# Patient Record
Sex: Male | Born: 1975 | Race: White | Hispanic: No | Marital: Married | Smoking: Current every day smoker
Health system: Southern US, Community
[De-identification: ages and names within clinical notes are randomized; demographics above are authoritative.]

## PROBLEM LIST (undated history)

## (undated) DIAGNOSIS — R55 Syncope and collapse: Secondary | ICD-10-CM

## (undated) HISTORY — PX: APPENDECTOMY: SHX54

---

## 2010-08-13 ENCOUNTER — Emergency Department: Payer: Self-pay | Admitting: Unknown Physician Specialty

## 2011-01-14 ENCOUNTER — Emergency Department: Payer: Self-pay | Admitting: Emergency Medicine

## 2011-12-02 ENCOUNTER — Emergency Department: Payer: Self-pay | Admitting: Unknown Physician Specialty

## 2011-12-02 LAB — COMPREHENSIVE METABOLIC PANEL
Alkaline Phosphatase: 52 U/L (ref 50–136)
Anion Gap: 9 (ref 7–16)
BUN: 9 mg/dL (ref 7–18)
Bilirubin,Total: 0.3 mg/dL (ref 0.2–1.0)
Creatinine: 0.85 mg/dL (ref 0.60–1.30)
EGFR (Non-African Amer.): >60
SGOT(AST): 10 U/L — ABNORMAL LOW (ref 15–37)
SGPT (ALT): 22 U/L (ref 12–78)
Total Protein: 7.5 g/dL (ref 6.4–8.2)

## 2011-12-02 LAB — URINALYSIS, COMPLETE
Bilirubin,UR: NEGATIVE
Ketone: NEGATIVE
Nitrite: NEGATIVE
Protein: NEGATIVE
RBC,UR: NONE SEEN /HPF (ref 0–5)
WBC UR: NONE SEEN /HPF (ref 0–5)

## 2011-12-02 LAB — CBC WITH DIFFERENTIAL/PLATELET
Basophil %: 0.6 %
Eosinophil %: 0.9 %
HCT: 40.1 % (ref 40.0–52.0)
HGB: 13.9 g/dL (ref 13.0–18.0)
Lymphocyte #: 2.8 10*3/uL (ref 1.0–3.6)
MCH: 31.8 pg (ref 26.0–34.0)
Monocyte #: 0.4 x10 3/mm (ref 0.2–1.0)
Monocyte %: 4.2 %
Neutrophil #: 6.3 10*3/uL (ref 1.4–6.5)
Neutrophil %: 65.6 %
Platelet: 345 10*3/uL (ref 150–440)
RBC: 4.37 10*6/uL — ABNORMAL LOW (ref 4.40–5.90)
RDW: 13.7 % (ref 11.5–14.5)
WBC: 9.6 10*3/uL (ref 3.8–10.6)

## 2011-12-02 LAB — AMYLASE: Amylase: 73 U/L (ref 25–115)

## 2012-10-12 ENCOUNTER — Emergency Department: Payer: Self-pay | Admitting: Emergency Medicine

## 2012-10-12 LAB — CBC
HCT: 42.8 % (ref 40.0–52.0)
HGB: 14.6 g/dL (ref 13.0–18.0)
MCHC: 34.2 g/dL (ref 32.0–36.0)
MCV: 89 fL (ref 80–100)
RBC: 4.83 10*6/uL (ref 4.40–5.90)
RDW: 13.1 % (ref 11.5–14.5)
WBC: 10.6 10*3/uL (ref 3.8–10.6)

## 2012-10-12 LAB — COMPREHENSIVE METABOLIC PANEL
Albumin: 3.9 g/dL (ref 3.4–5.0)
Alkaline Phosphatase: 53 U/L (ref 50–136)
Anion Gap: 4 — ABNORMAL LOW (ref 7–16)
BUN: 8 mg/dL (ref 7–18)
Calcium, Total: 8.9 mg/dL (ref 8.5–10.1)
Co2: 26 mmol/L (ref 21–32)
Creatinine: 0.85 mg/dL (ref 0.60–1.30)
EGFR (Non-African Amer.): >60
Osmolality: 279 (ref 275–301)
Potassium: 3.6 mmol/L (ref 3.5–5.1)
SGOT(AST): 20 U/L (ref 15–37)
Sodium: 140 mmol/L (ref 136–145)
Total Protein: 7.4 g/dL (ref 6.4–8.2)

## 2012-10-12 LAB — URINALYSIS, COMPLETE
Ketone: NEGATIVE
Ph: 6 (ref 4.5–8.0)
RBC,UR: 1 /HPF (ref 0–5)
Squamous Epithelial: NONE SEEN

## 2012-10-31 ENCOUNTER — Emergency Department: Payer: Self-pay | Admitting: Internal Medicine

## 2012-10-31 LAB — BASIC METABOLIC PANEL
Anion Gap: 6 — ABNORMAL LOW (ref 7–16)
Calcium, Total: 9.1 mg/dL (ref 8.5–10.1)
Chloride: 108 mmol/L — ABNORMAL HIGH (ref 98–107)
Co2: 23 mmol/L (ref 21–32)
Creatinine: 0.98 mg/dL (ref 0.60–1.30)
EGFR (African American): >60
EGFR (Non-African Amer.): >60
Glucose: 124 mg/dL — ABNORMAL HIGH (ref 65–99)
Osmolality: 275 (ref 275–301)
Sodium: 137 mmol/L (ref 136–145)

## 2012-10-31 LAB — URINALYSIS, COMPLETE
Bacteria: NONE SEEN
Bilirubin,UR: NEGATIVE
Blood: NEGATIVE
Glucose,UR: NEGATIVE mg/dL (ref 0–75)
Ketone: NEGATIVE
Ph: 7 (ref 4.5–8.0)
Protein: NEGATIVE
RBC,UR: <1 /HPF (ref 0–5)
Specific Gravity: 1.005 (ref 1.003–1.030)
Squamous Epithelial: NONE SEEN
WBC UR: NONE SEEN /HPF (ref 0–5)

## 2012-10-31 LAB — CBC
MCHC: 34.4 g/dL (ref 32.0–36.0)
Platelet: 362 10*3/uL (ref 150–440)
RDW: 13.5 % (ref 11.5–14.5)
WBC: 10.8 10*3/uL — ABNORMAL HIGH (ref 3.8–10.6)

## 2013-04-18 ENCOUNTER — Emergency Department (HOSPITAL_COMMUNITY)
Admission: EM | Admit: 2013-04-18 | Discharge: 2013-04-18 | Disposition: A | Discharge status: Home / Self Care | Payer: Self-pay | Attending: Emergency Medicine | Admitting: Emergency Medicine

## 2013-04-18 ENCOUNTER — Encounter (HOSPITAL_COMMUNITY): Payer: Self-pay | Admitting: Emergency Medicine

## 2013-04-18 ENCOUNTER — Emergency Department (HOSPITAL_COMMUNITY): Payer: Self-pay

## 2013-04-18 DIAGNOSIS — R197 Diarrhea, unspecified: Secondary | ICD-10-CM | POA: Insufficient documentation

## 2013-04-18 DIAGNOSIS — F17200 Nicotine dependence, unspecified, uncomplicated: Secondary | ICD-10-CM | POA: Insufficient documentation

## 2013-04-18 DIAGNOSIS — R109 Unspecified abdominal pain: Secondary | ICD-10-CM

## 2013-04-18 DIAGNOSIS — K439 Ventral hernia without obstruction or gangrene: Secondary | ICD-10-CM | POA: Insufficient documentation

## 2013-04-18 DIAGNOSIS — K429 Umbilical hernia without obstruction or gangrene: Secondary | ICD-10-CM | POA: Insufficient documentation

## 2013-04-18 DIAGNOSIS — IMO0001 Reserved for inherently not codable concepts without codable children: Secondary | ICD-10-CM | POA: Insufficient documentation

## 2013-04-18 DIAGNOSIS — R110 Nausea: Secondary | ICD-10-CM | POA: Insufficient documentation

## 2013-04-18 DIAGNOSIS — K458 Other specified abdominal hernia without obstruction or gangrene: Secondary | ICD-10-CM

## 2013-04-18 LAB — CBC WITH DIFFERENTIAL/PLATELET
Basophils Absolute: 0 10*3/uL (ref 0.0–0.1)
Basophils Relative: 0 % (ref 0–1)
EOS PCT: 0 % (ref 0–5)
Eosinophils Absolute: 0 10*3/uL (ref 0.0–0.7)
HEMATOCRIT: 45.1 % (ref 39.0–52.0)
HEMOGLOBIN: 15.5 g/dL (ref 13.0–17.0)
LYMPHS ABS: 2.1 10*3/uL (ref 0.7–4.0)
LYMPHS PCT: 16 % (ref 12–46)
MCH: 30 pg (ref 26.0–34.0)
MCHC: 34.4 g/dL (ref 30.0–36.0)
MCV: 87.2 fL (ref 78.0–100.0)
MONO ABS: 0.4 10*3/uL (ref 0.1–1.0)
MONOS PCT: 3 % (ref 3–12)
Neutro Abs: 10.2 10*3/uL — ABNORMAL HIGH (ref 1.7–7.7)
Neutrophils Relative %: 80 % — ABNORMAL HIGH (ref 43–77)
Platelets: 381 10*3/uL (ref 150–400)
RBC: 5.17 MIL/uL (ref 4.22–5.81)
RDW: 12.9 % (ref 11.5–15.5)
WBC: 12.8 10*3/uL — AB (ref 4.0–10.5)

## 2013-04-18 LAB — URINALYSIS, ROUTINE W REFLEX MICROSCOPIC
Bilirubin Urine: NEGATIVE
GLUCOSE, UA: NEGATIVE mg/dL
Hgb urine dipstick: NEGATIVE
Ketones, ur: NEGATIVE mg/dL
LEUKOCYTES UA: NEGATIVE
Nitrite: NEGATIVE
PH: 8 (ref 5.0–8.0)
Protein, ur: NEGATIVE mg/dL
Specific Gravity, Urine: 1.02 (ref 1.005–1.030)
Urobilinogen, UA: 0.2 mg/dL (ref 0.0–1.0)

## 2013-04-18 LAB — BASIC METABOLIC PANEL
BUN: 10 mg/dL (ref 6–23)
CHLORIDE: 105 meq/L (ref 96–112)
CO2: 28 meq/L (ref 19–32)
CREATININE: 0.8 mg/dL (ref 0.50–1.35)
Calcium: 9.6 mg/dL (ref 8.4–10.5)
GFR calc Af Amer: >90 mL/min (ref >90)
GFR calc non Af Amer: >90 mL/min (ref >90)
Glucose, Bld: 96 mg/dL (ref 70–99)
POTASSIUM: 4.4 meq/L (ref 3.7–5.3)
Sodium: 143 mEq/L (ref 137–147)

## 2013-04-18 MED ORDER — LOPERAMIDE HCL 2 MG PO TABS: 2.0000 mg | ORAL_TABLET | Freq: Four times a day (QID) | ORAL | Status: DC | PRN

## 2013-04-18 MED ORDER — PROMETHAZINE HCL 25 MG PO TABS: 25.0000 mg | ORAL_TABLET | Freq: Four times a day (QID) | ORAL | Status: DC | PRN

## 2013-04-18 MED ORDER — HYDROMORPHONE HCL PF 1 MG/ML IJ SOLN
1.0000 mg | Freq: Once | INTRAMUSCULAR | Status: AC
  Administered 2013-04-18: 1 mg via INTRAVENOUS
  Filled 2013-04-18: qty 1

## 2013-04-18 MED ORDER — HYDROCODONE-ACETAMINOPHEN 5-325 MG PO TABS: 1.0000 | ORAL_TABLET | Freq: Four times a day (QID) | ORAL | Status: DC | PRN

## 2013-04-18 MED ORDER — IOHEXOL 300 MG/ML  SOLN
100.0000 mL | Freq: Once | INTRAMUSCULAR | Status: AC | PRN
  Administered 2013-04-18: 100 mL via INTRAVENOUS

## 2013-04-18 MED ORDER — SODIUM CHLORIDE 0.9 % IV SOLN: INTRAVENOUS | Status: DC

## 2013-04-18 MED ORDER — SODIUM CHLORIDE 0.9 % IV BOLUS (SEPSIS)
1000.0000 mL | Freq: Once | INTRAVENOUS | Status: AC
  Administered 2013-04-18: 1000 mL via INTRAVENOUS

## 2013-04-18 MED ORDER — ONDANSETRON HCL 4 MG/2ML IJ SOLN
4.0000 mg | Freq: Once | INTRAMUSCULAR | Status: AC
  Administered 2013-04-18: 4 mg via INTRAVENOUS
  Filled 2013-04-18: qty 2

## 2013-04-18 MED ORDER — IOHEXOL 300 MG/ML  SOLN
50.0000 mL | Freq: Once | INTRAMUSCULAR | Status: AC | PRN
  Administered 2013-04-18: 50 mL via ORAL

## 2013-04-18 NOTE — ED Notes (Signed)
Patient c/o generalized abdominal pain; states was constipated earlier in the week, but now states having diarrhea.  States has felt nausea, but has not vomited.  States has no energy and just wants to lie in bed.

## 2013-04-18 NOTE — ED Provider Notes (Signed)
CSN: [id number]     Arrival date & time 04/18/13  1648 History   First MD Initiated Contact with Patient 04/18/13 1916     Chief Complaint  Patient presents with  . Abdominal Pain     (Consider location/radiation/quality/duration/timing/severity/associated sxs/prior Treatment) Patient is a 38 y.o. male presenting with abdominal pain. The history is provided by the patient.  Abdominal Pain Associated symptoms: chills, constipation, diarrhea, fever and nausea   Associated symptoms: no chest pain, no dysuria, no shortness of breath and no vomiting    Patient with onset of abdominal pain starting Monday. Pain is on the right side of the abdomen more so in the lower artery crampy in nature. Associated bodyaches nausea no vomiting questionable fever and chills. No distinct dysuria. Started out with like some constipation symptoms now having some diarrhea no blood in the bowel movements.   History reviewed. No pertinent past medical history. Past Surgical History  Procedure Laterality Date  . Appendectomy     No family history on file. History  Substance Use Topics  . Smoking status: Current Every Day Smoker  . Smokeless tobacco: Not on file  . Alcohol Use: Yes    Review of Systems  Constitutional: Positive for fever and chills.  HENT: Negative for congestion.   Eyes: Negative for redness.  Respiratory: Negative for shortness of breath.   Cardiovascular: Negative for chest pain.  Gastrointestinal: Positive for nausea, abdominal pain, diarrhea and constipation. Negative for vomiting.  Genitourinary: Negative for dysuria.  Musculoskeletal: Positive for myalgias.  Skin: Negative for rash.  Neurological: Negative for headaches.  Hematological: Does not bruise/bleed easily.  Psychiatric/Behavioral: Negative for confusion.      Allergies  Review of patient's allergies indicates no known allergies.  Home Medications   Current Outpatient Rx  Name  Route  Sig  Dispense  Refill   . ibuprofen (ADVIL,MOTRIN) 200 MG tablet   Oral   Take 400 mg by mouth once as needed for headache or moderate pain.         Marland Kitchen. Phenyleph-CPM-DM-Aspirin (ALKA-SELTZER PLUS COLD & COUGH) 7.09-02-08-325 MG TBEF   Oral   Take 2 tablets by mouth once as needed (for symptoms).         Marland Kitchen. HYDROcodone-acetaminophen (NORCO/VICODIN) 5-325 MG per tablet   Oral   Take 1-2 tablets by mouth every 6 (six) hours as needed for moderate pain.   10 tablet   0   . loperamide (IMODIUM A-D) 2 MG tablet   Oral   Take 1 tablet (2 mg total) by mouth 4 (four) times daily as needed for diarrhea or loose stools.   30 tablet   0   . promethazine (PHENERGAN) 25 MG tablet   Oral   Take 1 tablet (25 mg total) by mouth every 6 (six) hours as needed for nausea or vomiting.   12 tablet   0    BP 116/66  Pulse 54  Temp(Src) 98.2 F (36.8 C) (Oral)  Resp 20  Ht 6' (1.829 m)  Wt 200 lb (90.719 kg)  BMI 27.12 kg/m2  SpO2 98% Physical Exam  Nursing note and vitals reviewed. Constitutional: He is oriented to person, place, and time. He appears well-developed and well-nourished. No distress.  HENT:  Head: Normocephalic.  Eyes: Conjunctivae are normal. Pupils are equal, round, and reactive to light.  Neck: Normal range of motion.  Cardiovascular: Normal rate and regular rhythm.   No murmur heard. Pulmonary/Chest: Effort normal and breath sounds normal.  Abdominal: Soft.  Bowel sounds are normal. There is tenderness. There is no guarding.  Mild tenderness right side of abdomen.  Neurological: He is alert and oriented to person, place, and time. No cranial nerve deficit. He exhibits normal muscle tone. Coordination normal.  Skin: Skin is warm. No rash noted.    ED Course  Procedures (including critical care time) Labs Review Labs Reviewed  URINALYSIS, ROUTINE W REFLEX MICROSCOPIC - Abnormal; Notable for the following:    APPearance HAZY (*)    All other components within normal limits  CBC WITH  DIFFERENTIAL - Abnormal; Notable for the following:    WBC 12.8 (*)    Neutrophils Relative % 80 (*)    Neutro Abs 10.2 (*)    All other components within normal limits  BASIC METABOLIC PANEL   Results for orders placed during the hospital encounter of 04/18/13  URINALYSIS, ROUTINE W REFLEX MICROSCOPIC      Result Value Ref Range   Color, Urine YELLOW  YELLOW   APPearance HAZY (*) CLEAR   Specific Gravity, Urine 1.020  1.005 - 1.030   pH 8.0  5.0 - 8.0   Glucose, UA NEGATIVE  NEGATIVE mg/dL   Hgb urine dipstick NEGATIVE  NEGATIVE   Bilirubin Urine NEGATIVE  NEGATIVE   Ketones, ur NEGATIVE  NEGATIVE mg/dL   Protein, ur NEGATIVE  NEGATIVE mg/dL   Urobilinogen, UA 0.2  0.0 - 1.0 mg/dL   Nitrite NEGATIVE  NEGATIVE   Leukocytes, UA NEGATIVE  NEGATIVE  BASIC METABOLIC PANEL      Result Value Ref Range   Sodium 143  137 - 147 mEq/L   Potassium 4.4  3.7 - 5.3 mEq/L   Chloride 105  96 - 112 mEq/L   CO2 28  19 - 32 mEq/L   Glucose, Bld 96  70 - 99 mg/dL   BUN 10  6 - 23 mg/dL   Creatinine, Ser 9.89  0.50 - 1.35 mg/dL   Calcium 9.6  8.4 - 21.1 mg/dL   GFR calc non Af Amer >90  >90 mL/min   GFR calc Af Amer >90  >90 mL/min  CBC WITH DIFFERENTIAL      Result Value Ref Range   WBC 12.8 (*) 4.0 - 10.5 K/uL   RBC 5.17  4.22 - 5.81 MIL/uL   Hemoglobin 15.5  13.0 - 17.0 g/dL   HCT 94.1  74.0 - 81.4 %   MCV 87.2  78.0 - 100.0 fL   MCH 30.0  26.0 - 34.0 pg   MCHC 34.4  30.0 - 36.0 g/dL   RDW 48.1  85.6 - 31.4 %   Platelets 381  150 - 400 K/uL   Neutrophils Relative % 80 (*) 43 - 77 %   Neutro Abs 10.2 (*) 1.7 - 7.7 K/uL   Lymphocytes Relative 16  12 - 46 %   Lymphs Abs 2.1  0.7 - 4.0 K/uL   Monocytes Relative 3  3 - 12 %   Monocytes Absolute 0.4  0.1 - 1.0 K/uL   Eosinophils Relative 0  0 - 5 %   Eosinophils Absolute 0.0  0.0 - 0.7 K/uL   Basophils Relative 0  0 - 1 %   Basophils Absolute 0.0  0.0 - 0.1 K/uL    Imaging Review Ct Abdomen Pelvis W Contrast  04/18/2013   CLINICAL  DATA:  Generalized abdominal pain, constipation earlier this week canal with diarrhea, nausea, history smoking  EXAM: CT ABDOMEN AND PELVIS WITH CONTRAST  TECHNIQUE:  Multidetector CT imaging of the abdomen and pelvis was performed using the standard protocol following bolus administration of intravenous contrast. Sagittal and coronal MPR images reconstructed from axial data set.  CONTRAST:  OMNIPAQUE IOHEXOL 300 MG/ML SOLN IV. Dilute oral contrast.  COMPARISON:  None  FINDINGS: Lung bases clear.  Liver, spleen, pancreas, kidneys, and adrenal glands normal appearance.  Tiny splenule anterior to spleen.  Appendix not visualized.  Few scattered uncomplicated descending and sigmoid colonic diverticula.  Stomach and bowel loops otherwise normal appearance.  Normal appearing bladder and ureters.  No mass, adenopathy, free fluid or inflammatory process.  Small umbilical hernia containing fat  Small right Spigelian hernia containing fat.  Few scattered bone islands without acute osseous findings.  IMPRESSION: Minimal uncomplicated distal colonic diverticulosis.  Small umbilical hernia containing fat.  Small right Spigelian hernia containing fat.  No acute intra-abdominal or intrapelvic abnormalities.   Electronically Signed   By: Ulyses Southward M.D.   On: 04/18/2013 20:48     EKG Interpretation None      MDM   Final diagnoses:  Abdominal pain  Diarrhea  Hernia of flank    Patient symptoms may be viral based. However CT scan does show evidence of a umbilical hernia without complications and evidence of a spegalian  hernia without complicating factors. Will refer to general surgery for followup for that. Will treat symptomatically for the nausea  and abdominal pain and diarrhea. Patient nontoxic no acute distress. Patient's been symptomatic since Monday. Symptoms have been associated with some bodyaches and chills nausea but no vomiting. Pain is crampy in nature.  Patient's symptoms clinically appeared to  be consistent with appendicitis and cannot be consistent with appendicitis disease had his appendix removed. Specific cause of the right lower quadrant pain maybe related to the hernia.  Shelda Jakes, MD 04/18/13 2112

## 2013-04-18 NOTE — Discharge Instructions (Signed)
Symptoms may be related to a virus. Take antinausea medicine Phenergan as directed. Take the hydrocodone pain medicine as needed. If the diarrhea persists using Imodium right ear. Referral to Dr. Franky MachoMark Jenkins femoral surgery for evaluation of the hernia. Work note provided. Return for any new or worse symptoms.

## 2013-05-16 ENCOUNTER — Emergency Department (HOSPITAL_COMMUNITY)
Admission: EM | Admit: 2013-05-16 | Discharge: 2013-05-16 | Disposition: A | Discharge status: Home / Self Care | Payer: Self-pay | Attending: Emergency Medicine | Admitting: Emergency Medicine

## 2013-05-16 ENCOUNTER — Encounter (HOSPITAL_COMMUNITY): Payer: Self-pay | Admitting: Emergency Medicine

## 2013-05-16 DIAGNOSIS — F17200 Nicotine dependence, unspecified, uncomplicated: Secondary | ICD-10-CM | POA: Insufficient documentation

## 2013-05-16 DIAGNOSIS — R079 Chest pain, unspecified: Secondary | ICD-10-CM | POA: Insufficient documentation

## 2013-05-16 DIAGNOSIS — K5900 Constipation, unspecified: Secondary | ICD-10-CM | POA: Insufficient documentation

## 2013-05-16 DIAGNOSIS — K589 Irritable bowel syndrome without diarrhea: Secondary | ICD-10-CM

## 2013-05-16 DIAGNOSIS — R109 Unspecified abdominal pain: Secondary | ICD-10-CM

## 2013-05-16 DIAGNOSIS — Z9089 Acquired absence of other organs: Secondary | ICD-10-CM | POA: Insufficient documentation

## 2013-05-16 DIAGNOSIS — R3915 Urgency of urination: Secondary | ICD-10-CM | POA: Insufficient documentation

## 2013-05-16 LAB — BASIC METABOLIC PANEL
BUN: 9 mg/dL (ref 6–23)
CO2: 26 mEq/L (ref 19–32)
CREATININE: 0.93 mg/dL (ref 0.50–1.35)
Calcium: 9.3 mg/dL (ref 8.4–10.5)
Chloride: 105 mEq/L (ref 96–112)
Glucose, Bld: 84 mg/dL (ref 70–99)
Potassium: 3.8 mEq/L (ref 3.7–5.3)
Sodium: 143 mEq/L (ref 137–147)

## 2013-05-16 LAB — URINALYSIS, ROUTINE W REFLEX MICROSCOPIC
BILIRUBIN URINE: NEGATIVE
Glucose, UA: NEGATIVE mg/dL
HGB URINE DIPSTICK: NEGATIVE
Ketones, ur: NEGATIVE mg/dL
Leukocytes, UA: NEGATIVE
Nitrite: NEGATIVE
Protein, ur: NEGATIVE mg/dL
Specific Gravity, Urine: 1.025 (ref 1.005–1.030)
UROBILINOGEN UA: 0.2 mg/dL (ref 0.0–1.0)
pH: 6 (ref 5.0–8.0)

## 2013-05-16 LAB — HEPATIC FUNCTION PANEL
ALBUMIN: 3.9 g/dL (ref 3.5–5.2)
ALK PHOS: 58 U/L (ref 39–117)
ALT: 16 U/L (ref 0–53)
AST: 15 U/L (ref 0–37)
Bilirubin, Direct: <0.2 mg/dL (ref 0.0–0.3)
Total Bilirubin: 0.3 mg/dL (ref 0.3–1.2)
Total Protein: 7.4 g/dL (ref 6.0–8.3)

## 2013-05-16 LAB — CBC
HCT: 44.9 % (ref 39.0–52.0)
Hemoglobin: 15.5 g/dL (ref 13.0–17.0)
MCH: 29.7 pg (ref 26.0–34.0)
MCHC: 34.5 g/dL (ref 30.0–36.0)
MCV: 86 fL (ref 78.0–100.0)
Platelets: 364 10*3/uL (ref 150–400)
RBC: 5.22 MIL/uL (ref 4.22–5.81)
RDW: 13 % (ref 11.5–15.5)
WBC: 13.7 10*3/uL — ABNORMAL HIGH (ref 4.0–10.5)

## 2013-05-16 LAB — LIPASE, BLOOD: Lipase: 19 U/L (ref 11–59)

## 2013-05-16 MED ORDER — GLYCOPYRROLATE 0.2 MG/ML IJ SOLN
0.1000 mg | Freq: Once | INTRAMUSCULAR | Status: AC
  Administered 2013-05-16: 0.1 mg via INTRAVENOUS
  Filled 2013-05-16: qty 1

## 2013-05-16 MED ORDER — ONDANSETRON HCL 4 MG/2ML IJ SOLN
4.0000 mg | Freq: Once | INTRAMUSCULAR | Status: AC
  Administered 2013-05-16: 4 mg via INTRAVENOUS
  Filled 2013-05-16: qty 2

## 2013-05-16 MED ORDER — HYDROCODONE-ACETAMINOPHEN 5-325 MG PO TABS: 2.0000 | ORAL_TABLET | ORAL | Status: DC | PRN

## 2013-05-16 MED ORDER — MORPHINE SULFATE 4 MG/ML IJ SOLN
4.0000 mg | Freq: Once | INTRAMUSCULAR | Status: AC
  Administered 2013-05-16: 4 mg via INTRAVENOUS
  Filled 2013-05-16: qty 1

## 2013-05-16 MED ORDER — ONDANSETRON 4 MG PO TBDP: 4.0000 mg | ORAL_TABLET | Freq: Three times a day (TID) | ORAL | Status: DC | PRN

## 2013-05-16 MED ORDER — DICYCLOMINE HCL 20 MG PO TABS: 20.0000 mg | ORAL_TABLET | Freq: Three times a day (TID) | ORAL | Status: DC

## 2013-05-16 MED ORDER — SODIUM CHLORIDE 0.9 % IV BOLUS (SEPSIS)
1000.0000 mL | Freq: Once | INTRAVENOUS | Status: AC
  Administered 2013-05-16: 1000 mL via INTRAVENOUS

## 2013-05-16 NOTE — ED Notes (Signed)
Pt has rt sided abdominal pain for several weeks, worse this week. Denies N/V/D.

## 2013-05-16 NOTE — ED Provider Notes (Signed)
CSN: [id number]     Arrival date & time 05/16/13  1234 History  This chart was scribed for Isaac PorterMark Raistlin Gum, MD by Leone PayorSonum Patel, ED Scribe. This patient was seen in room [facility] and the patient's care was started 3:17 PM.    Chief Complaint  Patient presents with  . Abdominal Pain    rt side      The history is provided by the spouse and the patient. No language interpreter was used.    HPI Comments: Isaac Long is a 38 y.o. male who presents to the Emergency Department complaining of constant, worsening right sided abdominal pain for the past several weeks. He describes the pain as cramping, sharp, and severe. He states the pain is similar to when he had appendicitis but he has a history of appendectomy. He has associated nasuea, constipation (last normal BM yesterday), urinary urgency. He was seen on 04/18/13 for similar symptoms and was discharged home. He states his pain improved afterwards until it returned again. He states the pain is worse in the morning. Wife reports increased stress at home. He denies recent change in diet. He denies vomiting, diarrhea, melena, hematochezia. He is a daily smoker and occasional alcohol user.   History reviewed. No pertinent past medical history. Past Surgical History  Procedure Laterality Date  . Appendectomy     History reviewed. No pertinent family history. History  Substance Use Topics  . Smoking status: Current Every Day Smoker  . Smokeless tobacco: Not on file  . Alcohol Use: Yes    Review of Systems  Constitutional: Negative for fever, chills, diaphoresis, appetite change and fatigue.  HENT: Negative for mouth sores, sore throat, tinnitus and trouble swallowing.   Eyes: Negative for visual disturbance.  Respiratory: Negative for cough, chest tightness, shortness of breath and wheezing.   Cardiovascular: Positive for chest pain. Negative for leg swelling.  Gastrointestinal: Positive for nausea, abdominal pain and constipation. Negative  for vomiting, diarrhea, blood in stool, abdominal distention and anal bleeding.  Endocrine: Negative for polydipsia, polyphagia and polyuria.  Genitourinary: Positive for urgency. Negative for dysuria, frequency, hematuria, scrotal swelling and testicular pain.  Musculoskeletal: Negative for gait problem.  Skin: Negative for color change, pallor and rash.  Neurological: Negative for dizziness, syncope, light-headedness and headaches.  Hematological: Does not bruise/bleed easily.  Psychiatric/Behavioral: Negative for behavioral problems and confusion.      Allergies  Review of patient's allergies indicates no known allergies.  Home Medications   Prior to Admission medications   Medication Sig Start Date End Date Taking? Authorizing Provider  HYDROcodone-acetaminophen (NORCO/VICODIN) 5-325 MG per tablet Take 1-2 tablets by mouth every 6 (six) hours as needed for moderate pain. 04/18/13   Shelda JakesScott W. Zackowski, MD  ibuprofen (ADVIL,MOTRIN) 200 MG tablet Take 400 mg by mouth once as needed for headache or moderate pain.    Historical Provider, MD  loperamide (IMODIUM A-D) 2 MG tablet Take 1 tablet (2 mg total) by mouth 4 (four) times daily as needed for diarrhea or loose stools. 04/18/13   Shelda JakesScott W. Zackowski, MD  Phenyleph-CPM-DM-Aspirin (ALKA-SELTZER PLUS COLD & COUGH) 7.09-02-08-325 MG TBEF Take 2 tablets by mouth once as needed (for symptoms).    Historical Provider, MD  promethazine (PHENERGAN) 25 MG tablet Take 1 tablet (25 mg total) by mouth every 6 (six) hours as needed for nausea or vomiting. 04/18/13   Shelda JakesScott W. Zackowski, MD   BP 116/75  Pulse 60  Temp(Src) 98.4 F (36.9 C) (Oral)  Resp 16  Ht 5' 11.5" (1.816 m)  Wt 190 lb (86.183 kg)  BMI 26.13 kg/m2  SpO2 97% Physical Exam  Nursing note and vitals reviewed. Constitutional: He is oriented to person, place, and time. He appears well-developed and well-nourished. No distress.  HENT:  Head: Normocephalic.  Eyes: Conjunctivae are  normal. Pupils are equal, round, and reactive to light. No scleral icterus.  Neck: Normal range of motion. Neck supple. No thyromegaly present.  Cardiovascular: Normal rate and regular rhythm.  Exam reveals no gallop and no friction rub.   No murmur heard. Pulmonary/Chest: Effort normal and breath sounds normal. No respiratory distress. He has no wheezes. He has no rales.  Abdominal: Soft. Bowel sounds are normal. He exhibits no distension. There is tenderness. There is no rebound and negative Murphy's sign.  RUQ and right lateral abdominal tenderness. Negative Murphy's. No peritoneal signs.   Musculoskeletal: Normal range of motion. He exhibits no edema.  Neurological: He is alert and oriented to person, place, and time.  Skin: Skin is warm and dry. No rash noted.  Psychiatric: He has a normal mood and affect. His behavior is normal.    ED Course  Procedures (including critical care time)  DIAGNOSTIC STUDIES: Oxygen Saturation is 100% on RA, normal by my interpretation.    COORDINATION OF CARE: 3:24 PM Discussed treatment plan with pt at bedside and pt agreed to plan.   Labs Review Labs Reviewed  CBC - Abnormal; Notable for the following:    WBC 13.7 (*)    All other components within normal limits  BASIC METABOLIC PANEL  URINALYSIS, ROUTINE W REFLEX MICROSCOPIC  LIPASE, BLOOD  HEPATIC FUNCTION PANEL    Imaging Review No results found.   EKG Interpretation None      MDM   Final diagnoses:  Irritable bowel syndrome  Abdominal pain     His exam is benign. White count 13,000. CT 34 weeks ago showed normal other than small periumbilical, and spigellian hernia. No palpable hernia on exam today. Not an exam that suggest obstruction. Symptoms are intermittent and crampy and colicky. Likely irritable bowel. Rarely a.m., thus I doubt related. However, asked him to keep a food diary. Given a GI referral. Given him Bentyl and Zofran for home use.   I personally performed the  services described in this documentation, which was scribed in my presence. The recorded information has been reviewed and is accurate.   Isaac PorterMark Jayln Branscom, MD 05/16/13 [phone]

## 2013-05-16 NOTE — ED Notes (Signed)
Pt reports 6/10 abdominal pain, lasting about 1 year and worsening over the last month. Pt reports "nagging pain on my right side." Pt believes pain is exacerbated by stress, and states that "its bad enough I can't sleep on my right side." Pt reports that nothing relieves the pain. Pt denies N/V/D. Patient A&O and in NAD. MD at bedside.

## 2013-05-16 NOTE — Discharge Instructions (Signed)
Keep a food diary. Call GI doctor, Dr. Karilyn Cotaehman for followup. Bentyl (antispasmotic) as directed.  Abdominal Pain, Adult Many things can cause abdominal pain. Usually, abdominal pain is not caused by a disease and will improve without treatment. It can often be observed and treated at home. Your health care provider will do a physical exam and possibly order blood tests and X-rays to help determine the seriousness of your pain. However, in many cases, more time must pass before a clear cause of the pain can be found. Before that point, your health care provider may not know if you need more testing or further treatment. HOME CARE INSTRUCTIONS  Monitor your abdominal pain for any changes. The following actions may help to alleviate any discomfort you are experiencing:  Only take over-the-counter or prescription medicines as directed by your health care provider.  Do not take laxatives unless directed to do so by your health care provider.  Try a clear liquid diet (broth, tea, or water) as directed by your health care provider. Slowly move to a bland diet as tolerated. SEEK MEDICAL CARE IF:  You have unexplained abdominal pain.  You have abdominal pain associated with nausea or diarrhea.  You have pain when you urinate or have a bowel movement.  You experience abdominal pain that wakes you in the night.  You have abdominal pain that is worsened or improved by eating food.  You have abdominal pain that is worsened with eating fatty foods. SEEK IMMEDIATE MEDICAL CARE IF:   Your pain does not go away within 2 hours.  You have a fever.  You keep throwing up (vomiting).  Your pain is felt only in portions of the abdomen, such as the right side or the left lower portion of the abdomen.  You pass bloody or black tarry stools. MAKE SURE YOU:  Understand these instructions.   Will watch your condition.   Will get help right away if you are not doing well or get worse.  Document  Released: 10/28/2004 Document Revised: 11/08/2012 Document Reviewed: 09/27/2012 [facility] Patient Information 2014 [employer], [place].  Diet and Irritable Bowel Syndrome  No cure has been found for irritable bowel syndrome (IBS). Many options are available to treat the symptoms. Your caregiver will give you the best treatments available for your symptoms. He or she will also encourage you to manage stress and to make changes to your diet. You need to work with your caregiver and Registered Dietician to find the best combination of medicine, diet, counseling, and support to control your symptoms. The following are some diet suggestions. FOODS THAT MAKE IBS WORSE  Fatty foods, such as [place] fries.  Milk products, such as cheese or ice cream.  Chocolate.  Alcohol.  Caffeine (found in coffee and some sodas).  Carbonated drinks, such as soda. If certain foods cause symptoms, you should eat less of them or stop eating them. FOOD JOURNAL   Keep a journal of the foods that seem to cause distress. Write down:  What you are eating during the day and when.  What problems you are having after eating.  When the symptoms occur in relation to your meals.  What foods always make you feel badly.  Take your notes with you to your caregiver to see if you should stop eating certain foods. FOODS THAT MAKE IBS BETTER Fiber reduces IBS symptoms, especially constipation, because it makes stools soft, bulky, and easier to pass. Fiber is found in bran, bread, cereal, beans, fruit, and vegetables.  Examples of foods with fiber include:  Apples.  Peaches.  Pears.  Berries.  Figs.  Broccoli, raw.  Cabbage.  Carrots.  Raw peas.  Kidney beans.  Lima beans.  Whole-grain bread.  Whole-grain cereal. Add foods with fiber to your diet a little at a time. This will let your body get used to them. Too much fiber at once might cause gas and swelling of your abdomen. This can trigger symptoms in a  person with IBS. Caregivers usually recommend a diet with enough fiber to produce soft, painless bowel movements. High fiber diets may cause gas and bloating. However, these symptoms often go away within a few weeks, as your body adjusts. In many cases, dietary fiber may lessen IBS symptoms, particularly constipation. However, it may not help pain or diarrhea. High fiber diets keep the colon mildly enlarged (distended) with the added fiber. This may help prevent spasms in the colon. Some forms of fiber also keep water in the stool, thereby preventing hard stools that are difficult to pass.  Besides telling you to eat more foods with fiber, your caregiver may also tell you to get more fiber by taking a fiber pill or drinking water mixed with a special high fiber powder. An example of this is a natural fiber laxative containing psyllium seed.  TIPS  Large meals can cause cramping and diarrhea in people with IBS. If this happens to you, try eating 4 or 5 small meals a day, or try eating less at each of your usual 3 meals. It may also help if your meals are low in fat and high in carbohydrates. Examples of carbohydrates are pasta, rice, whole-grain breads and cereals, fruits, and vegetables.  If dairy products cause your symptoms to flare up, you can try eating less of those foods. You might be able to handle yogurt better than other dairy products, because it contains bacteria that helps with digestion. Dairy products are an important source of calcium and other nutrients. If you need to avoid dairy products, be sure to talk with a Registered Dietitian about getting these nutrients through other food sources.  Drink enough water and fluids to keep your urine clear or pale yellow. This is important, especially if you have diarrhea. FOR MORE INFORMATION  International Foundation for Functional Gastrointestinal Disorders: www.iffgd.org  National Digestive Diseases Information Clearinghouse:  digestive.StageSync.siniddk.nih.gov Document Released: 04/10/2003 Document Revised: 04/12/2011 Document Reviewed: 12/26/2006 [facility] Patient Information 2014 [place], [place].  Irritable Bowel Syndrome Irritable Bowel Syndrome (IBS) is caused by a disturbance of normal bowel function. Other terms used are spastic colon, mucous colitis, and irritable colon. It does not require surgery, nor does it lead to cancer. There is no cure for IBS. But with proper diet, stress reduction, and medication, you will find that your problems (symptoms) will gradually disappear or improve. IBS is a common digestive disorder. It usually appears in late adolescence or early adulthood. Women develop it twice as often as men. CAUSES  After food has been digested and absorbed in the small intestine, waste material is moved into the colon (large intestine). In the colon, water and salts are absorbed from the undigested products coming from the small intestine. The remaining residue, or fecal material, is held for elimination. Under normal circumstances, gentle, rhythmic contractions on the bowel walls push the fecal material along the colon towards the rectum. In IBS, however, these contractions are irregular and poorly coordinated. The fecal material is either retained too long, resulting in constipation, or expelled too  soon, producing diarrhea. SYMPTOMS  The most common symptom of IBS is pain. It is typically in the lower left side of the belly (abdomen). But it may occur anywhere in the abdomen. It can be felt as heartburn, backache, or even as a dull pain in the arms or shoulders. The pain comes from excessive bowel-muscle spasms and from the buildup of gas and fecal material in the colon. This pain:  Can range from sharp belly (abdominal) cramps to a dull, continuous ache.  Usually worsens soon after eating.  Is typically relieved by having a bowel movement or passing gas. Abdominal pain is usually accompanied by constipation.  But it may also produce diarrhea. The diarrhea typically occurs right after a meal or upon arising in the morning. The stools are typically soft and watery. They are often flecked with secretions (mucus). Other symptoms of IBS include:  Bloating.  Loss of appetite.  Heartburn.  Feeling sick to your stomach (nausea).  Belching  Vomiting  Gas. IBS may also cause a number of symptoms that are unrelated to the digestive system:  Fatigue.  Headaches.  Anxiety  Shortness of breath  Difficulty in concentrating.  Dizziness. These symptoms tend to come and go. DIAGNOSIS  The symptoms of IBS closely mimic the symptoms of other, more serious digestive disorders. So your caregiver may wish to perform a variety of additional tests to exclude these disorders. He/she wants to be certain of learning what is wrong (diagnosis). The nature and purpose of each test will be explained to you. TREATMENT A number of medications are available to help correct bowel function and/or relieve bowel spasms and abdominal pain. Among the drugs available are:  Mild, non-irritating laxatives for severe constipation and to help restore normal bowel habits.  Specific anti-diarrheal medications to treat severe or prolonged diarrhea.  Anti-spasmodic agents to relieve intestinal cramps.  Your caregiver may also decide to treat you with a mild tranquilizer or sedative during unusually stressful periods in your life. The important thing to remember is that if any drug is prescribed for you, make sure that you take it exactly as directed. Make sure that your caregiver knows how well it worked for you. HOME CARE INSTRUCTIONS   Avoid foods that are high in fat or oils. Some examples ZOX:[id number] cream, butter, frankfurters, sausage, and other fatty meats.  Avoid foods that have a laxative effect, such as fruit, fruit juice, and dairy products.  Cut out carbonated drinks, chewing gum, and "gassy" foods, such as  beans and cabbage. This may help relieve bloating and belching.  Bran taken with plenty of liquids may help relieve constipation.  Keep track of what foods seem to trigger your symptoms.  Avoid emotionally charged situations or circumstances that produce anxiety.  Start or continue exercising.  Get plenty of rest and sleep. MAKE SURE YOU:   Understand these instructions.  Will watch your condition.  Will get help right away if you are not doing well or get worse. Document Released: 01/18/2005 Document Revised: 04/12/2011 Document Reviewed: 09/08/2007 [facility] Patient Information 2014 [place], [place].

## 2013-05-16 NOTE — Care Management Note (Signed)
ED/CM noted patient did not have health insurance and/or PCP listed in the computer.  Patient was given the [place] resource handout with information on the clinics, food pantries, and the handout for new health insurance sign-up.  Patient expressed appreciation for information received. 

## 2014-03-04 ENCOUNTER — Emergency Department: Payer: Self-pay | Admitting: Emergency Medicine

## 2014-12-17 ENCOUNTER — Encounter: Payer: Self-pay | Admitting: *Deleted

## 2014-12-17 ENCOUNTER — Emergency Department: Payer: Medicaid Other

## 2014-12-17 ENCOUNTER — Observation Stay
Admission: EM | Admit: 2014-12-17 | Discharge: 2014-12-19 | Disposition: A | Discharge status: Home / Self Care | Payer: Medicaid Other | Attending: Internal Medicine | Admitting: Internal Medicine

## 2014-12-17 DIAGNOSIS — Z823 Family history of stroke: Secondary | ICD-10-CM | POA: Insufficient documentation

## 2014-12-17 DIAGNOSIS — Z79891 Long term (current) use of opiate analgesic: Secondary | ICD-10-CM | POA: Insufficient documentation

## 2014-12-17 DIAGNOSIS — R1011 Right upper quadrant pain: Secondary | ICD-10-CM | POA: Insufficient documentation

## 2014-12-17 DIAGNOSIS — F17200 Nicotine dependence, unspecified, uncomplicated: Secondary | ICD-10-CM | POA: Insufficient documentation

## 2014-12-17 DIAGNOSIS — R531 Weakness: Secondary | ICD-10-CM | POA: Insufficient documentation

## 2014-12-17 DIAGNOSIS — R079 Chest pain, unspecified: Secondary | ICD-10-CM | POA: Insufficient documentation

## 2014-12-17 DIAGNOSIS — R001 Bradycardia, unspecified: Secondary | ICD-10-CM | POA: Diagnosis present

## 2014-12-17 DIAGNOSIS — Z79899 Other long term (current) drug therapy: Secondary | ICD-10-CM | POA: Insufficient documentation

## 2014-12-17 DIAGNOSIS — Z8249 Family history of ischemic heart disease and other diseases of the circulatory system: Secondary | ICD-10-CM | POA: Diagnosis not present

## 2014-12-17 DIAGNOSIS — Z9049 Acquired absence of other specified parts of digestive tract: Secondary | ICD-10-CM | POA: Diagnosis not present

## 2014-12-17 DIAGNOSIS — R55 Syncope and collapse: Principal | ICD-10-CM | POA: Insufficient documentation

## 2014-12-17 DIAGNOSIS — R110 Nausea: Secondary | ICD-10-CM | POA: Insufficient documentation

## 2014-12-17 HISTORY — DX: Syncope and collapse: R55

## 2014-12-17 LAB — CBC
HCT: 45.1 % (ref 40.0–52.0)
Hemoglobin: 14.9 g/dL (ref 13.0–18.0)
MCH: 29.6 pg (ref 26.0–34.0)
MCHC: 33.1 g/dL (ref 32.0–36.0)
MCV: 89.4 fL (ref 80.0–100.0)
PLATELETS: 412 10*3/uL (ref 150–440)
RBC: 5.04 MIL/uL (ref 4.40–5.90)
RDW: 13 % (ref 11.5–14.5)
WBC: 11.1 10*3/uL — ABNORMAL HIGH (ref 3.8–10.6)

## 2014-12-17 LAB — BASIC METABOLIC PANEL
ANION GAP: 5 (ref 5–15)
BUN: 10 mg/dL (ref 6–20)
CALCIUM: 9.1 mg/dL (ref 8.9–10.3)
CO2: 26 mmol/L (ref 22–32)
Chloride: 108 mmol/L (ref 101–111)
Creatinine, Ser: 0.8 mg/dL (ref 0.61–1.24)
GFR calc non Af Amer: >60 mL/min (ref >60)
GLUCOSE: 90 mg/dL (ref 65–99)
POTASSIUM: 3.5 mmol/L (ref 3.5–5.1)
SODIUM: 139 mmol/L (ref 135–145)

## 2014-12-17 LAB — TROPONIN I: Troponin I: <0.03 ng/mL (ref <0.031)

## 2014-12-17 MED ORDER — ACETAMINOPHEN 500 MG PO TABS
ORAL_TABLET | ORAL | Status: AC
  Administered 2014-12-17: 1000 mg via ORAL
  Filled 2014-12-17: qty 2

## 2014-12-17 MED ORDER — ACETAMINOPHEN 500 MG PO TABS
1000.0000 mg | ORAL_TABLET | Freq: Once | ORAL | Status: AC
  Administered 2014-12-17: 1000 mg via ORAL

## 2014-12-17 NOTE — ED Notes (Signed)
Patient states he began to feel "bad" on Sunday evening. Went into bathroom and passed out. States wife found him on the bathroom floor. Felt nauseated and went to bed. Wife states he was diaphoretic and went right to sleep. Patient again felt "bad" today and felt like he was going to pass out.

## 2014-12-17 NOTE — ED Notes (Signed)
2 nites ago was found on floor, broke toliet, had another episdoe same nite, last pm was going back to bed and felt like he was going to pass out, today developed weakness, has had chest pain after episodes, , has weakness today

## 2014-12-17 NOTE — ED Provider Notes (Signed)
[facility]  ____________________________________________  Time seen: Approximately 9:37 PM  I have reviewed the triage vital signs and the nursing notes.   HISTORY  Chief Complaint Loss of Consciousness    HPI Isaac Long is a 39 y.o. male who reports that Sunday night he had 2 episodes of syncope. He passed out in the bathroom where he broke the toilet by cracking the toilet tank. Almost passed out last night was able to lay down until he felt better. Patient reports he was sitting on the couch got nauseated and got up to go the bathroom and passed out there. He had ringing in the ears and tunnel vision and then that happened passing out episode happened. His wife reports he got pale and sweaty and pale first and sweaty after began to come around. Patient has had no energy or appetite for the last 2 or 3 days he has chest pain which is pressure in the center of the chest comes and goes last a few minutes goes away does not seem to be brought on by anything in particular is not short of breath and nauseated or sweaty with the chest pain. Patient reports he broke his right shoulder the end of the clavicle about 15 years ago. That's been hurting him ever since and has some anal in the right side of his abdomen right under the ribs there has been coming and going for quite some time as well. CT of the abdomen done previously shows a small fat-containing umbilical hernia and a small spigalean hernia. Neither the shoulder pain of the abdominal pain or new headache. You do with the present problem.  History reviewed. No pertinent past medical history.  There are no active problems to display for this patient.   Past Surgical History  Procedure Laterality Date  . Appendectomy      Current Outpatient Rx  Name  Route  Sig  Dispense  Refill  . acetaminophen (TYLENOL) 325 MG tablet   Oral   Take 650 mg by mouth every 6 (six) hours  as needed.         . dicyclomine (BENTYL) 20 MG tablet   Oral   Take 1 tablet (20 mg total) by mouth 4 (four) times daily -  before meals and at bedtime. Patient not taking: Reported on 12/17/2014   60 tablet   1   . HYDROcodone-acetaminophen (NORCO/VICODIN) 5-325 MG per tablet   Oral   Take 2 tablets by mouth every 4 (four) hours as needed. Patient not taking: Reported on 12/17/2014   10 tablet   0   . ondansetron (ZOFRAN ODT) 4 MG disintegrating tablet   Oral   Take 1 tablet (4 mg total) by mouth every 8 (eight) hours as needed for nausea. Patient not taking: Reported on 12/17/2014   6 tablet   0     Allergies Review of patient's allergies indicates no known allergies.  No family history on file.  Social History Social History  Substance Use Topics  . Smoking status: Current Every Day Smoker  . Smokeless tobacco: None  . Alcohol Use: Yes    Review of Systems Constitutional: No fever/chills Eyes: No visual changes. ENT: No sore throat. Cardiovascular chest pain. Respirator shortness of breath. Gastrointestinal:  abdominal pain.   nausea, no vomiting.  No diarrhea.  No constipation. Genitourinary: Negative for dysuria. Musculoskeletal: Negative for back pain. Skin: Negative for rash. Neurological: Negative for headaches, focal weakness or  numbness.  10-point ROS otherwise negative.  ____________________________________________   PHYSICAL EXAM:  VITAL SIGNS: ED Triage Vitals  Enc Vitals Group     BP 12/17/14 1656 126/87 mmHg     Pulse Rate 12/17/14 1656 74     Resp 12/17/14 1656 20     Temp 12/17/14 1656 97.8 F (36.6 C)     Temp Source 12/17/14 1656 Oral     SpO2 12/17/14 1656 98 %     Weight 12/17/14 1656 185 lb (83.915 kg)     Height 12/17/14 1656 6' (1.829 m)     Head Cir --      Peak Flow --      Pain Score --      Pain Loc --      Pain Edu? --      Excl. in GC? --     Constitutional: Alert and oriented. Well appearing and in no acute  distress. Eyes: Conjunctivae are normal. PERRL. EOMI. Head: Atraumatic. Nose: No congestion/rhinnorhea. Mouth/Throat: Mucous membranes are moist.  Oropharynx non-erythematous. Neck: No stridor.  No cervical spine tenderness to palpation. Cardiovascular: Normal rate, regular rhythm. Grossly normal heart sounds.  Good peripheral circulation. Respiratory: Normal respiratory effort.  No retractions. Lungs CTAB. Gastrointestinal: Soft and nontender. No distention. No abdominal bruits. No CVA tenderness. Musculoskeletal: No lower extremity tenderness nor edema.  No joint effusions. Neurologic:  Normal speech and language. No gross focal neurologic deficits are appreciated. Cranial nerves II through XII are intact cerebellar finger to nose is normal motor is 5 over 5 throughout sensation is normal  Skin:  Skin is warm, dry and intact. No rash noted. Psychiatric: Mood and affect are normal. Speech and behavior are normal.  ____________________________________________   LABS (all labs ordered are listed, but only abnormal results are displayed)  Labs Reviewed  CBC - Abnormal; Notable for the following:    WBC 11.1 (*)    All other components within normal limits  BASIC METABOLIC PANEL  TROPONIN I  HEPATIC FUNCTION PANEL  CBG MONITORING, ED   ____________________________________________  EKG  EKG read and interpreted by me shows normal sinus rhythm at a rate of 63 normal axis. His reading possible left atrial enlargement which is possibly true otherwise EKG is normal ____________________________________________  RADIOLOGY Head CT is read as no acute disease Chest x-ray as no acute disease except for the radiologist reads a possibility of a distal right clavicle fracture. The patient is not tender there to palpation. He relates a history of a remote fracture and not very bone with no acute tenderness and a history of a fracture in that spot I will not do the shoulder  films. _____________________________________   PROCEDURES    ____________________________________________   INITIAL IMPRESSION / ASSESSMENT AND PLAN / ED COURSE  Pertinent labs & imaging results that were available during my care of the patient were reviewed by me and considered in my medical decision making (see chart for details).   ____________________________________________   FINAL CLINICAL IMPRESSION(S) / ED DIAGNOSES  Final diagnoses:  Syncope, unspecified syncope type  Near syncope      Arnaldo Natal, MD 12/17/14 2314

## 2014-12-18 ENCOUNTER — Encounter: Payer: Self-pay | Admitting: Internal Medicine

## 2014-12-18 ENCOUNTER — Observation Stay (HOSPITAL_BASED_OUTPATIENT_CLINIC_OR_DEPARTMENT_OTHER)
Admit: 2014-12-18 | Discharge: 2014-12-18 | Disposition: A | Discharge status: Home / Self Care | Payer: Medicaid Other | Attending: Internal Medicine | Admitting: Internal Medicine

## 2014-12-18 DIAGNOSIS — R1011 Right upper quadrant pain: Secondary | ICD-10-CM | POA: Diagnosis present

## 2014-12-18 DIAGNOSIS — R001 Bradycardia, unspecified: Secondary | ICD-10-CM | POA: Diagnosis not present

## 2014-12-18 DIAGNOSIS — R55 Syncope and collapse: Secondary | ICD-10-CM | POA: Diagnosis not present

## 2014-12-18 LAB — BASIC METABOLIC PANEL
ANION GAP: 7 (ref 5–15)
BUN: 12 mg/dL (ref 6–20)
CALCIUM: 8.7 mg/dL — AB (ref 8.9–10.3)
CO2: 25 mmol/L (ref 22–32)
Chloride: 109 mmol/L (ref 101–111)
Creatinine, Ser: 0.85 mg/dL (ref 0.61–1.24)
GLUCOSE: 103 mg/dL — AB (ref 65–99)
POTASSIUM: 3.8 mmol/L (ref 3.5–5.1)
SODIUM: 141 mmol/L (ref 135–145)

## 2014-12-18 LAB — CBC
HEMATOCRIT: 41.5 % (ref 40.0–52.0)
HEMATOCRIT: 42.2 % (ref 40.0–52.0)
HEMOGLOBIN: 13.8 g/dL (ref 13.0–18.0)
HEMOGLOBIN: 14.3 g/dL (ref 13.0–18.0)
MCH: 29.9 pg (ref 26.0–34.0)
MCH: 30.6 pg (ref 26.0–34.0)
MCHC: 33.3 g/dL (ref 32.0–36.0)
MCHC: 33.9 g/dL (ref 32.0–36.0)
MCV: 89.7 fL (ref 80.0–100.0)
MCV: 90.4 fL (ref 80.0–100.0)
Platelets: 359 10*3/uL (ref 150–440)
Platelets: 350 10*3/uL (ref 150–440)
RBC: 4.62 MIL/uL (ref 4.40–5.90)
RBC: 4.67 MIL/uL (ref 4.40–5.90)
RDW: 13.1 % (ref 11.5–14.5)
RDW: 13.2 % (ref 11.5–14.5)
WBC: 10.6 10*3/uL (ref 3.8–10.6)
WBC: 9.8 10*3/uL (ref 3.8–10.6)

## 2014-12-18 LAB — CREATININE, SERUM
CREATININE: 0.9 mg/dL (ref 0.61–1.24)
GFR calc Af Amer: >60 mL/min (ref >60)

## 2014-12-18 LAB — HEPATIC FUNCTION PANEL
ALT: 20 U/L (ref 17–63)
AST: 18 U/L (ref 15–41)
Albumin: 3.7 g/dL (ref 3.5–5.0)
Alkaline Phosphatase: 39 U/L (ref 38–126)
TOTAL PROTEIN: 6.3 g/dL — AB (ref 6.5–8.1)
Total Bilirubin: 0.5 mg/dL (ref 0.3–1.2)

## 2014-12-18 LAB — TROPONIN I

## 2014-12-18 LAB — PHOSPHORUS: Phosphorus: 3.7 mg/dL (ref 2.5–4.6)

## 2014-12-18 LAB — MAGNESIUM: Magnesium: 1.9 mg/dL (ref 1.7–2.4)

## 2014-12-18 MED ORDER — ONDANSETRON HCL 4 MG/2ML IJ SOLN: 4.0000 mg | Freq: Four times a day (QID) | INTRAMUSCULAR | Status: DC | PRN

## 2014-12-18 MED ORDER — SODIUM CHLORIDE 0.9 % IJ SOLN
3.0000 mL | Freq: Two times a day (BID) | INTRAMUSCULAR | Status: DC
  Administered 2014-12-18 (×2): 3 mL via INTRAVENOUS

## 2014-12-18 MED ORDER — ACETAMINOPHEN 325 MG PO TABS: 650.0000 mg | ORAL_TABLET | Freq: Four times a day (QID) | ORAL | Status: DC | PRN

## 2014-12-18 MED ORDER — ONDANSETRON HCL 4 MG PO TABS: 4.0000 mg | ORAL_TABLET | Freq: Four times a day (QID) | ORAL | Status: DC | PRN

## 2014-12-18 MED ORDER — ENOXAPARIN SODIUM 40 MG/0.4ML ~~LOC~~ SOLN
40.0000 mg | SUBCUTANEOUS | Status: DC
  Administered 2014-12-18 – 2014-12-19 (×2): 40 mg via SUBCUTANEOUS
  Filled 2014-12-18 (×2): qty 0.4

## 2014-12-18 MED ORDER — ACETAMINOPHEN 650 MG RE SUPP: 650.0000 mg | Freq: Four times a day (QID) | RECTAL | Status: DC | PRN

## 2014-12-18 NOTE — H&P (Addendum)
[facility] [place] [facility]   PATIENT NAME: Isaac Long    MR#:  [id number]  DATE OF BIRTH:  [birth year]  DATE OF ADMISSION:  12/17/2014  PRIMARY CARE PHYSICIAN: No PCP Per Patient   REQUESTING/REFERRING PHYSICIAN: Darnelle CatalanMalinda, MD  CHIEF COMPLAINT:   Chief Complaint  Patient presents with  . Loss of Consciousness    HISTORY OF PRESENT ILLNESS:  Isaac Hewaron Therriault  is a 39 y.o. male who presents with syncopal episodes. Patient states that 3 days ago he had a syncopal episode. He states that he was on his sofa in his living room when he began to feel nauseated, he got up from the bathroom a time he got there he had "tunnel vision", and then the next he remembers is his wife wake him up from the floor. His wife is present with him in the ED she received history of present illness today. She states that she woke him up, but that he passed out again immediately. He states that following day he had another near syncopal event. He states that he again had the tunnel vision but this time was able to get himself to his bed before he actually passed out. He denies any palpitations, but does state that he feels like his heart "gulps". He has had significant associated malaise and fatigue since the syncopal events, as well as some decreased appetite. He states he also had some transient chest pain related to the events as well. In the ED was found to be in sinus rhythm, but bradycardic. Hospitalists were called for admission.  PAST MEDICAL HISTORY:   Past Medical History  Diagnosis Date  . Patient denies medical problems     PAST SURGICAL HISTORY:   Past Surgical History  Procedure Laterality Date  . Appendectomy      SOCIAL HISTORY:   Social History  Substance Use Topics  . Smoking status: Current Every Day Smoker  . Smokeless tobacco: Not on file  . Alcohol Use: Yes    FAMILY HISTORY:   Family History  Problem Relation Age of Onset  . Cancer    .  Hypertension    . Stroke      DRUG ALLERGIES:  No Known Allergies  MEDICATIONS AT HOME:   Prior to Admission medications   Medication Sig Start Date End Date Taking? Authorizing Provider  acetaminophen (TYLENOL) 325 MG tablet Take 650 mg by mouth every 6 (six) hours as needed.   Yes Historical Provider, MD  dicyclomine (BENTYL) 20 MG tablet Take 1 tablet (20 mg total) by mouth 4 (four) times daily -  before meals and at bedtime. Patient not taking: Reported on 12/17/2014 05/16/13   Rolland PorterMark James, MD  HYDROcodone-acetaminophen (NORCO/VICODIN) 5-325 MG per tablet Take 2 tablets by mouth every 4 (four) hours as needed. Patient not taking: Reported on 12/17/2014 05/16/13   Rolland PorterMark James, MD  ondansetron (ZOFRAN ODT) 4 MG disintegrating tablet Take 1 tablet (4 mg total) by mouth every 8 (eight) hours as needed for nausea. Patient not taking: Reported on 12/17/2014 05/16/13   Rolland PorterMark James, MD    REVIEW OF SYSTEMS:  Review of Systems  Constitutional: Negative for fever, chills, weight loss and malaise/fatigue.  HENT: Negative for ear pain, hearing loss and tinnitus.   Eyes: Negative for blurred vision, double vision, pain and redness.  Respiratory: Negative for cough, hemoptysis and shortness of breath.   Cardiovascular: Negative for chest pain, palpitations, orthopnea and leg swelling.  Gastrointestinal: Negative for  nausea, vomiting, abdominal pain, diarrhea and constipation.  Genitourinary: Negative for dysuria, frequency and hematuria.  Musculoskeletal: Negative for back pain, joint pain and neck pain.  Skin:       No acne, rash, or lesions  Neurological: Positive for loss of consciousness (syncope). Negative for dizziness, tremors, focal weakness and weakness.  Endo/Heme/Allergies: Negative for polydipsia. Does not bruise/bleed easily.  Psychiatric/Behavioral: Negative for depression. The patient is not nervous/anxious and does not have insomnia.      VITAL SIGNS:   Filed Vitals:    12/17/14 2330 12/17/14 2345 12/18/14 0000 12/18/14 0015  BP: 117/80  120/90   Pulse: 55 49 46 46  Temp:      TempSrc:      Resp:   14 15  Height:      Weight:      SpO2: 98% 97% 100% 98%   Wt Readings from Last 3 Encounters:  12/17/14 83.915 kg (185 lb)  05/16/13 86.183 kg (190 lb)  04/18/13 90.719 kg (200 lb)    PHYSICAL EXAMINATION:  Physical Exam  Vitals reviewed. Constitutional: He is oriented to person, place, and time. He appears well-developed and well-nourished. No distress.  HENT:  Head: Normocephalic and atraumatic.  Mouth/Throat: Oropharynx is clear and moist.  Eyes: Conjunctivae and EOM are normal. Pupils are equal, round, and reactive to light. No scleral icterus.  Neck: Normal range of motion. Neck supple. No JVD present. No thyromegaly present.  Cardiovascular: Regular rhythm and intact distal pulses.  Exam reveals no gallop and no friction rub.   No murmur heard. Bradycardic  Respiratory: Effort normal and breath sounds normal. No respiratory distress. He has no wheezes. He has no rales.  GI: Soft. Bowel sounds are normal. He exhibits no distension. There is no tenderness.  Musculoskeletal: Normal range of motion. He exhibits no edema.  No arthritis, no gout  Lymphadenopathy:    He has no cervical adenopathy.  Neurological: He is alert and oriented to person, place, and time. No cranial nerve deficit.  No dysarthria, no aphasia  Skin: Skin is warm and dry. No rash noted. No erythema.  Psychiatric: He has a normal mood and affect. His behavior is normal. Judgment and thought content normal.    LABORATORY PANEL:   CBC  Recent Labs Lab 12/17/14 1659  WBC 11.1*  HGB 14.9  HCT 45.1  PLT 412   ------------------------------------------------------------------------------------------------------------------  Chemistries   Recent Labs Lab 12/17/14 1659  NA 139  K 3.5  CL 108  CO2 26  GLUCOSE 90  BUN 10  CREATININE 0.80  CALCIUM 9.1    ------------------------------------------------------------------------------------------------------------------  Cardiac Enzymes  Recent Labs Lab 12/17/14 1659  TROPONINI <0.03   ------------------------------------------------------------------------------------------------------------------  RADIOLOGY:  Ct Head Wo Contrast  12/17/2014  CLINICAL DATA:  Found on floor 2 nights ago with near syncopal episode today along with weakness and chest pain. EXAM: CT HEAD WITHOUT CONTRAST TECHNIQUE: Contiguous axial images were obtained from the base of the skull through the vertex without intravenous contrast. COMPARISON:  None. FINDINGS: Ventricles, cisterns and other CSF spaces are within normal. No evidence of mass, mass effect, shift of midline structures or acute hemorrhage. No evidence of acute infarction. Bones and soft tissues are within normal. IMPRESSION: No acute intracranial findings. Electronically Signed   By: Elberta Fortis M.D.   On: 12/17/2014 20:54   Dg Chest Portable 1 View  12/17/2014  CLINICAL DATA:  Found on floor 2 nights ago with near syncopal episode today. Chest pain and weakness.  EXAM: PORTABLE CHEST 1 VIEW COMPARISON:  None. FINDINGS: Lungs are adequately inflated without consolidation or effusion. Cardiomediastinal silhouette is within normal. Possible fracture over the distal right clavicle at the [facility] joint. IMPRESSION: No acute cardiopulmonary disease. Possible fracture of the distal right clavicle. Recommend right shoulder series for further evaluation. Electronically Signed   By: Elberta Fortis M.D.   On: 12/17/2014 21:02    EKG:   Orders placed or performed during the hospital encounter of 12/17/14  . ED EKG  . ED EKG  . EKG 12-Lead  . EKG 12-Lead    IMPRESSION AND PLAN:  Principal Problem:   Syncope - unclear etiology, though given his history and symptoms seems likely cardiogenic source. We'll admit to med on telemetry, get an echocardiogram tomorrow,  trend his cardiac enzymes tonight, order cardiology consult. Active Problems:   Bradycardia - sinus rhythm, maintained on telemetry for now and monitor his rhythm and rate.   Right upper quadrant/flank pain - states this is been chronic, that he was told he had a hernia, and another time a kidney stone, and another time pyelonephritis. Treatment for these things has not improved his pain. He was on an antispasmodic recently. He is not complaining about this pain being anything significant at this time, we will continue to monitor.  All the records are reviewed and case discussed with ED provider. Management plans discussed with the patient and/or family.  DVT PROPHYLAXIS: SubQ lovenox  ADMISSION STATUS: Observation  CODE STATUS: Full  TOTAL TIME TAKING CARE OF THIS PATIENT: 45 minutes.    Kady Toothaker FIELDING 12/18/2014, 1:02 AM  [facility]  Office  [phone]  CC: Primary care physician; No PCP Per Patient

## 2014-12-18 NOTE — Consult Note (Signed)
Cardiology Consultation Note  Patient ID: Isaac Long, MRN: [id number], DOB/AGE: [birth year] 39 y.o. Admit date: 12/17/2014   Date of Consult: 12/18/2014 Primary Physician: No PCP Per Patient Primary Cardiologist: New to [facility]  Chief Complaint: LOC Reason for Consult: Syncope  HPI: 39 y.o. male with h/o prior syncope x 2 who presented to [facility] on 11/15 after suffering another syncopal episode on 11/13 x 2.   No previously known cardiac history. He first suffered a syncopal episode back in 2006 while eating dinner. He reports sudden onset of drooling for several seconds with no communication. No cardiac work up at that time. There were no syncopal episodes reported until 09/2014. At that time he had just donated plasma and was feeling fine when he left the center. He was driving home with his wife. He began to notice "tunnel vision" and felt like he was going to pass out. He pulled off to the side of the road and passed out. He does not recall for how long. Again, he did not seek medical care at that time.   He was at home watching TV on 11/13 when he began to feel like he was going to pass out with associated nausea. He got up and went to the restroom to void. Post voiding he developed "tunnel vision" with "crackling in my ears" and the next thing he remembers is his wife siding over him. He subsequently suffered a second syncopal episode. Per the patient report his wife has told him he was out for approximately 1 minute each time. He had no associated chest pain, palpitations, SOB, diaphoresis, or vomiting. He does report rare episodes of "gulps" with his heart.        Upon the patient's arrival to [facility] they were found to have negative troponin x 2, WBC 11.1-->10.6, . ECG showed NSR with sinus arrhythmia, 63 bpm, inferolateral Q waves, CXR showed no acute cardiopulmonary process with possible fracture of the distal right clavicle. CT head showed no acute intracranial findings. Telemetry showed  sinus rhythm with sinus bradycardia into the mid 40's to 50's while sleeping.  He has not been on any known rate limiting agents. Echo is pending. He has also noticed chronic RUQ pain.    Past Medical History  Diagnosis Date  . Syncope     a. first episode 2006 while eating dinner, 2nd episode 09/2014 while driving s/p donating plasma      Most Recent Cardiac Studies: Echo 12/18/2014 pending   Surgical History:  Past Surgical History  Procedure Laterality Date  . Appendectomy       Home Meds: Prior to Admission medications   Medication Sig Start Date End Date Taking? Authorizing Provider  acetaminophen (TYLENOL) 325 MG tablet Take 650 mg by mouth every 6 (six) hours as needed.   Yes Historical Provider, MD  dicyclomine (BENTYL) 20 MG tablet Take 1 tablet (20 mg total) by mouth 4 (four) times daily -  before meals and at bedtime. Patient not taking: Reported on 12/17/2014 05/16/13   Rolland PorterMark James, MD  HYDROcodone-acetaminophen (NORCO/VICODIN) 5-325 MG per tablet Take 2 tablets by mouth every 4 (four) hours as needed. Patient not taking: Reported on 12/17/2014 05/16/13   Rolland PorterMark James, MD  ondansetron (ZOFRAN ODT) 4 MG disintegrating tablet Take 1 tablet (4 mg total) by mouth every 8 (eight) hours as needed for nausea. Patient not taking: Reported on 12/17/2014 05/16/13   Rolland PorterMark James, MD    Inpatient Medications:  . enoxaparin (LOVENOX) injection  40 mg Subcutaneous Q24H  . sodium chloride  3 mL Intravenous Q12H      Allergies: No Known Allergies  Social History   Social History  . Marital Status: Married    Spouse Name: N/A  . Number of Children: N/A  . Years of Education: N/A   Occupational History  . Not on file.   Social History Main Topics  . Smoking status: Current Every Day Smoker -- 1.00 packs/day  . Smokeless tobacco: Not on file  . Alcohol Use: Yes  . Drug Use: No  . Sexual Activity: Not Currently   Other Topics Concern  . Not on file   Social History Narrative      Family History  Problem Relation Age of Onset  . Cancer    . Hypertension    . Stroke       Review of Systems: Review of Systems  Constitutional: Positive for malaise/fatigue. Negative for fever, chills, weight loss and diaphoresis.  HENT: Negative for congestion.   Eyes: Negative for blurred vision, double vision, photophobia, pain, discharge and redness.  Respiratory: Negative for cough, hemoptysis, sputum production, shortness of breath and wheezing.   Cardiovascular: Positive for chest pain and palpitations. Negative for orthopnea, claudication, leg swelling and PND.  Gastrointestinal: Positive for nausea. Negative for heartburn, vomiting, abdominal pain, diarrhea and constipation.  Genitourinary: Negative for dysuria, urgency, frequency, hematuria and flank pain.  Musculoskeletal: Positive for falls. Negative for myalgias, back pain, joint pain and neck pain.  Skin: Negative for rash.  Neurological: Positive for dizziness, loss of consciousness and weakness. Negative for tingling, tremors, sensory change, speech change, focal weakness and seizures.  Endo/Heme/Allergies: Does not bruise/bleed easily.  Psychiatric/Behavioral: Negative for substance abuse. The patient is not nervous/anxious.   All other systems reviewed and are negative.   Labs:  Recent Labs  12/17/14 1659 12/18/14 0239 12/18/14 0746  TROPONINI <0.03 <0.03 <0.03   Lab Results  Component Value Date   WBC 9.8 12/18/2014   HGB 14.3 12/18/2014   HCT 42.2 12/18/2014   MCV 90.4 12/18/2014   PLT 350 12/18/2014     Recent Labs Lab 12/18/14 0239 12/18/14 0746  NA  --  141  K  --  3.8  CL  --  109  CO2  --  25  BUN  --  12  CREATININE 0.90 0.85  CALCIUM  --  8.7*  PROT 6.3*  --   BILITOT 0.5  --   ALKPHOS 39  --   ALT 20  --   AST 18  --   GLUCOSE  --  103*   No results found for: CHOL, HDL, LDLCALC, TRIG No results found for: DDIMER  Radiology/Studies:  Ct Head Wo Contrast  12/17/2014   CLINICAL DATA:  Found on floor 2 nights ago with near syncopal episode today along with weakness and chest pain. EXAM: CT HEAD WITHOUT CONTRAST TECHNIQUE: Contiguous axial images were obtained from the base of the skull through the vertex without intravenous contrast. COMPARISON:  None. FINDINGS: Ventricles, cisterns and other CSF spaces are within normal. No evidence of mass, mass effect, shift of midline structures or acute hemorrhage. No evidence of acute infarction. Bones and soft tissues are within normal. IMPRESSION: No acute intracranial findings. Electronically Signed   By: Elberta Fortis M.D.   On: 12/17/2014 20:54   Dg Chest Portable 1 View  12/17/2014  CLINICAL DATA:  Found on floor 2 nights ago with near syncopal episode today. Chest pain and  weakness. EXAM: PORTABLE CHEST 1 VIEW COMPARISON:  None. FINDINGS: Lungs are adequately inflated without consolidation or effusion. Cardiomediastinal silhouette is within normal. Possible fracture over the distal right clavicle at the [facility] joint. IMPRESSION: No acute cardiopulmonary disease. Possible fracture of the distal right clavicle. Recommend right shoulder series for further evaluation. Electronically Signed   By: Elberta Fortis M.D.   On: 12/17/2014 21:02    EKG: NSR with sinus arrhythmia, 63 bpm, inferolateral Q waves   Weights: Filed Weights   12/17/14 1656 12/18/14 0147  Weight: 185 lb (83.915 kg) 191 lb 3.2 oz (86.728 kg)     Physical Exam: Blood pressure 116/64, pulse 56, temperature 97.8 F (36.6 C), temperature source Oral, resp. rate 16, height 6' (1.829 m), weight 191 lb 3.2 oz (86.728 kg), SpO2 98 %. Body mass index is 25.93 kg/(m^2). General: Well developed, well nourished, in no acute distress. Head: Normocephalic, atraumatic, sclera non-icteric, no xanthomas, nares are without discharge.  Neck: Negative for carotid bruits. JVD not elevated. Lungs: Clear bilaterally to auscultation without wheezes, rales, or rhonchi. Breathing  is unlabored. Heart: RRR with S1 S2. No murmurs, rubs, or gallops appreciated. Abdomen: Soft, non-tender, non-distended with normoactive bowel sounds. No hepatomegaly. No rebound/guarding. No obvious abdominal masses. Msk:  Strength and tone appear normal for age. Extremities: No clubbing or cyanosis. No edema.  Distal pedal pulses are 2+ and equal bilaterally. Neuro: Alert and oriented X 3. No facial asymmetry. No focal deficit. Moves all extremities spontaneously. Psych:  Responds to questions appropriately with a normal affect.    Assessment and Plan:   1. Syncope: -Of uncertain etiology at this time given there has been some aspects of vasovagal and neurocardiogenic/postmicturition vs possible cardiogenic/arrhythmia     -Echo is pending to evaluate EF, wall motion, valvular function and structure, and right-sided pressure -Heart rate has been bradycardic into the 40's and 50's on telemetry while patient has been at rest, continue to monitor on telemetry while inpatient -If no significant arrhythmia or pause is seen on telemetry would need 30 day event monitor as an outpatient  -Avoid AV nodal blocking agents -Patient has been advised to avoid driving at this time as it is uncertain to the etiology of this syncope -Check TSH -Would recommend EP follow up   Signed, Eula Listen, PA-C Pager: [phone] 12/18/2014, 9:32 AM
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
## 2014-12-18 NOTE — Progress Notes (Signed)
Per Dr. Imogene Burnhen patient does not need the 4th troponin drawn due to patient has three negative troponin.

## 2014-12-18 NOTE — Care Management (Signed)
Patient admitted under observation 11/16 for syncope.  it is reported that patient most likely will discharge on a 30 day event monitor.

## 2014-12-18 NOTE — Progress Notes (Signed)
Patient arrived to 2A Room 232. Patient denies pain and all questions answered. Patient oriented to unit and Fall Safety Plan signed. Skin assessment completed with Farley Lyachel RN. Nursing staff will continue to monitor. Lamonte RicherKara A Amandalynn Pitz, RN

## 2014-12-18 NOTE — Progress Notes (Signed)
*  PRELIMINARY RESULTS* Echocardiogram 2D Echocardiogram has been performed.  Isaac Long 12/18/2014, 8:57 AM

## 2014-12-19 ENCOUNTER — Telehealth: Payer: Self-pay

## 2014-12-19 ENCOUNTER — Observation Stay
Admit: 2014-12-19 | Discharge: 2014-12-19 | Disposition: A | Discharge status: Home / Self Care | Payer: Medicaid Other | Attending: Physician Assistant | Admitting: Physician Assistant

## 2014-12-19 DIAGNOSIS — R55 Syncope and collapse: Secondary | ICD-10-CM | POA: Diagnosis not present

## 2014-12-19 DIAGNOSIS — R001 Bradycardia, unspecified: Secondary | ICD-10-CM | POA: Diagnosis not present

## 2014-12-19 DIAGNOSIS — R110 Nausea: Secondary | ICD-10-CM | POA: Insufficient documentation

## 2014-12-19 DIAGNOSIS — R1011 Right upper quadrant pain: Secondary | ICD-10-CM

## 2014-12-19 NOTE — Progress Notes (Signed)
Patient: Isaac Long / Admit Date: 12/17/2014 / Date of Encounter: 12/19/2014, 7:56 AM   Subjective: Feels well this morning. Up sitting in his bed laughing at his phone. Ambulated in the halls without symptoms.   Review of Systems: Review of Systems  Constitutional: Positive for malaise/fatigue. Negative for fever, chills, weight loss and diaphoresis.  HENT: Negative for congestion.   Eyes: Negative for discharge and redness.  Respiratory: Negative for cough, hemoptysis, sputum production, shortness of breath and wheezing.   Cardiovascular: Negative for chest pain, palpitations, orthopnea, claudication, leg swelling and PND.  Gastrointestinal: Negative for nausea and vomiting.  Musculoskeletal: Negative for falls.  Skin: Negative for rash.  Neurological: Positive for weakness. Negative for dizziness, tingling, tremors, sensory change, speech change, focal weakness and loss of consciousness.  Endo/Heme/Allergies: Does not bruise/bleed easily.  Psychiatric/Behavioral: The patient is nervous/anxious.     Objective: Telemetry: sinus brady, mid 50's currently. With ambulation HR into the 60's. Overnight brief episodes with HR into the low 40's/upper 30's with a low of 39 Physical Exam: Blood pressure 114/66, pulse 56, temperature 97.7 F (36.5 C), temperature source Oral, resp. rate 16, height 6' (1.829 m), weight 191 lb 3.2 oz (86.728 kg), SpO2 99 %. Body mass index is 25.93 kg/(m^2). General: Well developed, well nourished, in no acute distress. Head: Normocephalic, atraumatic, sclera non-icteric, no xanthomas, nares are without discharge. Neck: Negative for carotid bruits. JVP not elevated. Lungs: Clear bilaterally to auscultation without wheezes, rales, or rhonchi. Breathing is unlabored. Heart: Bradycardic S1 S2 without murmurs, rubs, or gallops.  Abdomen: Soft, non-tender, non-distended with normoactive bowel sounds. No rebound/guarding. Extremities: No clubbing or  cyanosis. No edema. Distal pedal pulses are 2+ and equal bilaterally. Neuro: Alert and oriented X 3. Moves all extremities spontaneously. Psych:  Responds to questions appropriately with a normal affect.   Intake/Output Summary (Last 24 hours) at 12/19/14 0756 Last data filed at 12/18/14 1700  Gross per 24 hour  Intake    240 ml  Output    800 ml  Net   -560 ml    Inpatient Medications:  . enoxaparin (LOVENOX) injection  40 mg Subcutaneous Q24H  . sodium chloride  3 mL Intravenous Q12H   Infusions:    Labs:  Recent Labs  12/17/14 1659 12/18/14 0239 12/18/14 0746  NA 139  --  141  K 3.5  --  3.8  CL 108  --  109  CO2 26  --  25  GLUCOSE 90  --  103*  BUN 10  --  12  CREATININE 0.80 0.90 0.85  CALCIUM 9.1  --  8.7*  MG  --  1.9  --   PHOS  --  3.7  --     Recent Labs  12/18/14 0239  AST 18  ALT 20  ALKPHOS 39  BILITOT 0.5  PROT 6.3*  ALBUMIN 3.7    Recent Labs  12/18/14 0239 12/18/14 0746  WBC 10.6 9.8  HGB 13.8 14.3  HCT 41.5 42.2  MCV 89.7 90.4  PLT 359 350    Recent Labs  12/17/14 1659 12/18/14 0239 12/18/14 0746  TROPONINI <0.03 <0.03 <0.03   Invalid input(s): POCBNP No results for input(s): HGBA1C in the last 72 hours.   Weights: Filed Weights   12/17/14 1656 12/18/14 0147  Weight: 185 lb (83.915 kg) 191 lb 3.2 oz (86.728 kg)     Radiology/Studies:  Ct Head Wo Contrast  12/17/2014  CLINICAL DATA:  Found on floor  2 nights ago with near syncopal episode today along with weakness and chest pain. EXAM: CT HEAD WITHOUT CONTRAST TECHNIQUE: Contiguous axial images were obtained from the base of the skull through the vertex without intravenous contrast. COMPARISON:  None. FINDINGS: Ventricles, cisterns and other CSF spaces are within normal. No evidence of mass, mass effect, shift of midline structures or acute hemorrhage. No evidence of acute infarction. Bones and soft tissues are within normal. IMPRESSION: No acute intracranial findings.  Electronically Signed   By: Elberta Fortis M.D.   On: 12/17/2014 20:54   Dg Chest Portable 1 View  12/17/2014  CLINICAL DATA:  Found on floor 2 nights ago with near syncopal episode today. Chest pain and weakness. EXAM: PORTABLE CHEST 1 VIEW COMPARISON:  None. FINDINGS: Lungs are adequately inflated without consolidation or effusion. Cardiomediastinal silhouette is within normal. Possible fracture over the distal right clavicle at the [facility] joint. IMPRESSION: No acute cardiopulmonary disease. Possible fracture of the distal right clavicle. Recommend right shoulder series for further evaluation. Electronically Signed   By: Elberta Fortis M.D.   On: 12/17/2014 21:02     Assessment and Plan   1. Syncope: -Of uncertain etiology at this time given there has been some aspects of vasovagal and neurocardiogenic/postmicturition vs possible cardiogenic/arrhythmia  -Echo showed normal LV function and wall motion -With ambulation he exhibited normal chronotropic response with heart rate into the 60's. With rest overnight his heart rate will drop into the low 40's/upper 30s for brief periods. He was asymptomatic -If no significant arrhythmia or pause is seen on telemetry would need 30 day event monitor as an outpatient. Would have patient wear a 48 hour Holter monitor at time of discharge as a bridge to event monitor   -Avoid AV nodal blocking agents -Patient has been advised to avoid driving at this time as it is uncertain to the etiology of this syncope -Check TSH -Would recommend outpatient EP follow up   Signed, Eula Listen, PA-C Pager: [phone] 12/19/2014, 7:56 AM    Attending Note Patient seen and examined, agree with detailed note above,  Patient presentation and plan discussed on rounds.   Long discussion with the patient going over his history Suspect he had vasovagal syncope in the setting of nausea, GI issues Broke the toilet on his fall in the bathroom Currently feels  well Very sporadic episodes, previously associated with donating blood  Okay to skip the 24 or 48 hour monitor, and agree with the 30 day monitor (Patient does not want the 48-hour monitor) Recommended hydration, salt loading, compression hose, abdominal binder Full recurrent symptoms, would lay supine --Outpatient clinic follow-up, contact information provided -=-If he continues to have symptoms despite fluid loading, high salt intake, could start fludrocortisone for blood pressure support   Signed: Dossie Arbour  M.D., Ph.D. [facility]
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
## 2014-12-19 NOTE — Discharge Instructions (Signed)
Heart healthy diet. Smoking cessation. Recommend no driving until evaluation complete. F/u cardiologist for EP study.

## 2014-12-19 NOTE — Progress Notes (Signed)
Paged on-call cardiology MD at the start of the shift to have patient signed off to be discharged, but MD was unable to come see the patient. Will notify day-shift RN of situation. Nursing staff will continue to monitor. Lamonte RicherKara A Iylah Dworkin, RN

## 2014-12-19 NOTE — Progress Notes (Signed)
   12/19/14 0750  Clinical Encounter Type  Visited With Patient  Visit Type Initial  Consult/Referral To Chaplain  Spiritual Encounters  Spiritual Needs Prayer  Stress Factors  Patient Stress Factors Health changes  Met w/patient during morning rounds. Patient exhibited positive attitude and good spirit. No physical pain reported. Excited to go home. Chap. Koby Hartfield G. [place]

## 2014-12-19 NOTE — Telephone Encounter (Signed)
Order placed w/ [employer]. Pt should receive a call today to verify mailing address.

## 2014-12-19 NOTE — Telephone Encounter (Signed)
-----   Message from Sondra Bargesyan M Dunn, PA-C sent at 12/19/2014  8:15 AM EST ----- Can we get this guy a 30 day event monitor ordered?  Dx: syncope

## 2014-12-19 NOTE — Progress Notes (Signed)
Discharge instructions given. Education given on syncope and bradycardia. Patient has no home medications. Instructed to eat salt and hydrate and no driving until cardiac issues are determined. Confirmed with Dr. Mariah MillingGollan that patient does not need to wait on a holter monitor because they will be doing a 30 day monitor at home. IV and tele discontinued. Patient has no further questions.

## 2014-12-23 ENCOUNTER — Encounter: Payer: Self-pay | Admitting: *Deleted

## 2014-12-23 ENCOUNTER — Encounter: Payer: Medicaid Other | Admitting: Cardiovascular Disease

## 2014-12-23 DIAGNOSIS — R0989 Other specified symptoms and signs involving the circulatory and respiratory systems: Secondary | ICD-10-CM

## 2014-12-24 NOTE — Telephone Encounter (Signed)
Received notification from [employer] that they were unable to contact pt to verify mailing info. They cancelled order on their end and pt will not receive monitor.  Pt did not show up for hospital f/u w/ Dr. Kirke CorinArida yesterday.

## 2014-12-25 ENCOUNTER — Telehealth: Payer: Self-pay

## 2014-12-25 NOTE — Telephone Encounter (Signed)
This was sent in error to the refill pool. Please advise

## 2014-12-25 NOTE — Telephone Encounter (Signed)
Pt wife called, states pt has not received his 30 day monitor. Please call.

## 2014-12-30 NOTE — Telephone Encounter (Signed)
Pt wife calling asking about monitor. States it was suppose to be given to them weeks ago. Or should have received it a while ago.  Please advise

## 2014-12-30 NOTE — Telephone Encounter (Signed)
This encounter was created in error - please disregard.

## 2014-12-30 NOTE — Telephone Encounter (Signed)
Spoke w/ pt's wife.  Advised her that 30 day monitor was ordered on 11/17, but [employer] cancelled on 12/24/14, as they were unable to contact pt.  Pt's wife asks that I enter her phone #, as husband will not answer an # he does not know.  She requests a letter excusing pt from work last week, as he did not feel well, as well as note for him to return to work. Advised her that pt did not show for his hospital f/u appt. Advised her that I will reenter pt's info into [employer] for 30 day monitor and expressed the importance of her answering the phone to confirm mailing address. Transferred her to scheduling to reschedule missed appt.

## 2014-12-30 NOTE — Telephone Encounter (Signed)
Pt called Wednesday inquiring about his 30 day monitor. States he has not received it yet. Please call and advise

## 2014-12-31 ENCOUNTER — Telehealth: Payer: Self-pay | Admitting: *Deleted

## 2014-12-31 NOTE — Telephone Encounter (Signed)
Letter printed and signed. Will bring down from the hospital shortly.

## 2014-12-31 NOTE — Telephone Encounter (Signed)
[facility]

## 2014-12-31 NOTE — Telephone Encounter (Signed)
Pt wife calling stating that pt needs a letter or some kind of documentation that states pt was hospitalized for the 2 days he was in They need it today or it can jeopardize  pt job.  Work fax number: [phone] attn: Brett CanalesSteve

## 2015-01-02 ENCOUNTER — Encounter: Payer: Self-pay | Admitting: Cardiovascular Disease

## 2015-01-02 ENCOUNTER — Encounter (INDEPENDENT_AMBULATORY_CARE_PROVIDER_SITE_OTHER): Payer: Medicaid Other

## 2015-01-02 ENCOUNTER — Ambulatory Visit (INDEPENDENT_AMBULATORY_CARE_PROVIDER_SITE_OTHER): Payer: Medicaid Other | Admitting: Cardiovascular Disease

## 2015-01-02 VITALS — BP 130/80 | HR 62 | Ht 72.0 in | Wt 193.0 lb

## 2015-01-02 DIAGNOSIS — R55 Syncope and collapse: Secondary | ICD-10-CM | POA: Diagnosis not present

## 2015-01-02 NOTE — Patient Instructions (Signed)
Medication Instructions:  Your physician recommends that you continue on your current medications as directed. Please refer to the Current Medication list given to you today.   Labwork: none  Testing/Procedures: none  Follow-Up: Your physician recommends that you schedule a follow-up appointment in: three months with Dr. Arida.    Any Other Special Instructions Will Be Listed Below (If Applicable).     If you need a refill on your cardiac medications before your next appointment, please call your pharmacy.   

## 2015-01-03 ENCOUNTER — Telehealth: Payer: Self-pay | Admitting: *Deleted

## 2015-01-03 ENCOUNTER — Telehealth: Payer: Self-pay

## 2015-01-03 NOTE — Telephone Encounter (Signed)
Left message on phone call [employer] 800 number to make sure 30 day monitor is activated. Left CB number

## 2015-01-03 NOTE — Telephone Encounter (Signed)
Please let wife know if she can come pick up letter.   [phone].

## 2015-01-03 NOTE — Telephone Encounter (Signed)
Pt wife calling asking if we can write a letter for patient for out of work today She states he was having some dizziness when he woke up and could not go to work Please advise
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
## 2015-01-05 NOTE — Assessment & Plan Note (Signed)
From the description, he seems to have most likely vasovagal syncope. He also has mild orthostatic dizziness. He was not orthostatic by blood pressure today but his heart rate did go up from 58-80 bpm which might be contributing. I advised him to stay well-hydrated and not to restrict sodium intake. I gave him instructions of how to abort a full syncopal episode given that he does have a prodrome. He was noted to have significant bradycardia while hospitalized which could be still related to the same etiology of syncope. Nonetheless, I think it's important to exclude arrhythmia. A 30 day monitor has already been arranged and he will keep this on.

## 2015-01-06 NOTE — Telephone Encounter (Signed)
S/w wife who is asking for a work note for pt as he was out of work Friday. Informed wife we can not write a work note for a day that pt was not seen in the office.  Pt had 12/1 OV w/Dr. Kirke CorinArida. No mention of work restrictions during visit. Wife verbalized understanding and states "we will get back to you". Wife had no further questions.

## 2015-01-19 ENCOUNTER — Emergency Department
Admission: EM | Admit: 2015-01-19 | Discharge: 2015-01-19 | Disposition: A | Discharge status: Home / Self Care | Payer: Medicaid Other | Attending: Emergency Medicine | Admitting: Emergency Medicine

## 2015-01-19 ENCOUNTER — Encounter: Payer: Self-pay | Admitting: Emergency Medicine

## 2015-01-19 DIAGNOSIS — R531 Weakness: Secondary | ICD-10-CM | POA: Insufficient documentation

## 2015-01-19 DIAGNOSIS — F17200 Nicotine dependence, unspecified, uncomplicated: Secondary | ICD-10-CM | POA: Diagnosis not present

## 2015-01-19 DIAGNOSIS — R5383 Other fatigue: Secondary | ICD-10-CM | POA: Insufficient documentation

## 2015-01-19 LAB — BASIC METABOLIC PANEL
Anion gap: 6 (ref 5–15)
BUN: 8 mg/dL (ref 6–20)
CO2: 26 mmol/L (ref 22–32)
Calcium: 9.2 mg/dL (ref 8.9–10.3)
Chloride: 108 mmol/L (ref 101–111)
Creatinine, Ser: 0.87 mg/dL (ref 0.61–1.24)
Glucose, Bld: 113 mg/dL — ABNORMAL HIGH (ref 65–99)
POTASSIUM: 4 mmol/L (ref 3.5–5.1)
SODIUM: 140 mmol/L (ref 135–145)

## 2015-01-19 LAB — CBC
HEMATOCRIT: 44 % (ref 40.0–52.0)
Hemoglobin: 15.1 g/dL (ref 13.0–18.0)
MCH: 30.4 pg (ref 26.0–34.0)
MCHC: 34.2 g/dL (ref 32.0–36.0)
MCV: 88.9 fL (ref 80.0–100.0)
Platelets: 348 10*3/uL (ref 150–440)
RBC: 4.95 MIL/uL (ref 4.40–5.90)
RDW: 13 % (ref 11.5–14.5)
WBC: 7.2 10*3/uL (ref 3.8–10.6)

## 2015-01-19 LAB — CK: CK TOTAL: 96 U/L (ref 49–397)

## 2015-01-19 LAB — TSH: TSH: 1.735 u[IU]/mL (ref 0.350–4.500)

## 2015-01-19 MED ORDER — ARMODAFINIL 150 MG PO TABS: 150.0000 mg | ORAL_TABLET | ORAL | Status: AC

## 2015-01-19 NOTE — ED Notes (Signed)
Patient presents to the ED with weakness and body aches x 1 month.  Patient reports having a syncopal episode and being admitted to the hospital about 1 month ago.  At that time, patient's heart rate was 37.  Patient now wears a heart monitor and has an appointment with cardiologist in March.  Patient denies any new symptoms but reports increased weakness over the past week.  Patient has called out of work 4 days this week.  Denies fever.
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
## 2015-01-19 NOTE — ED Notes (Signed)
Patient states he has been wearing holter monitor since 11/17. Has appointment with cardiology in March. States he has no PCP. Concerned about generalized fatigue and pain.

## 2015-01-19 NOTE — Discharge Instructions (Signed)
Please seek medical attention for any high fevers, chest pain, shortness of breath, change in behavior, persistent vomiting, bloody stool or any other new or concerning symptoms. ° ° °Fatigue °Fatigue is feeling tired all of the time, a lack of energy, or a lack of motivation. Occasional or mild fatigue is often a normal response to activity or life in general. However, long-lasting (chronic) or extreme fatigue may indicate an underlying medical condition. °HOME CARE INSTRUCTIONS  °Watch your fatigue for any changes. The following actions may help to lessen any discomfort you are feeling: °· Talk to your health care provider about how much sleep you need each night. Try to get the required amount every night. °· Take medicines only as directed by your health care provider. °· Eat a healthy and nutritious diet. Ask your health care provider if you need help changing your diet. °· Drink enough fluid to keep your urine clear or pale yellow. °· Practice ways of relaxing, such as yoga, meditation, massage therapy, or acupuncture. °· Exercise regularly.   °· Change situations that cause you stress. Try to keep your work and personal routine reasonable. °· Do not abuse illegal drugs. °· Limit alcohol intake to no more than 1 drink per day for nonpregnant women and 2 drinks per day for men. One drink equals 12 ounces of beer, 5 ounces of wine, or 1½ ounces of hard liquor. °· Take a multivitamin, if directed by your health care provider. °SEEK MEDICAL CARE IF:  °· Your fatigue does not get better. °· You have a fever.   °· You have unintentional weight loss or gain. °· You have headaches.   °· You have difficulty:   °¨ Falling asleep. °¨ Sleeping throughout the night. °· You feel angry, guilty, anxious, or sad.    °· You are unable to have a bowel movement (constipation).   °· You skin is dry.    °· Your legs or another part of your body is swollen.   °SEEK IMMEDIATE MEDICAL CARE IF:  °· You feel confused.   °· Your vision  is blurry. °· You feel faint or pass out.   °· You have a severe headache.   °· You have severe abdominal, pelvic, or back pain.   °· You have chest pain, shortness of breath, or an irregular or fast heartbeat.   °· You are unable to urinate or you urinate less than normal.   °· You develop abnormal bleeding, such as bleeding from the rectum, vagina, nose, lungs, or nipples. °· You vomit blood.    °· You have thoughts about harming yourself or committing suicide.   °· You are worried that you might harm someone else.   °  °This information is not intended to replace advice given to you by your health care provider. Make sure you discuss any questions you have with your health care provider. °  °Document Released: 11/15/2006 Document Revised: 02/08/2014 Document Reviewed: 05/22/2013 °Elsevier Interactive Patient Education ©2016 [employer] ° °

## 2015-01-30 ENCOUNTER — Encounter: Payer: Medicaid Other | Admitting: Physician Assistant

## 2015-02-14 ENCOUNTER — Ambulatory Visit: Payer: Self-pay | Admitting: Family Medicine

## 2015-04-04 ENCOUNTER — Ambulatory Visit: Payer: Medicaid Other | Admitting: Cardiovascular Disease

## 2016-03-21 IMAGING — CR DG CHEST 1V PORT
1 series · 1 of 1 positions shown · non-contrast
Comparison: None.

CLINICAL DATA: Found on [HOSPITAL] nights ago with near syncopal
episode today. Chest pain and weakness.

EXAM:
PORTABLE CHEST 1 VIEW

[x chest ap]
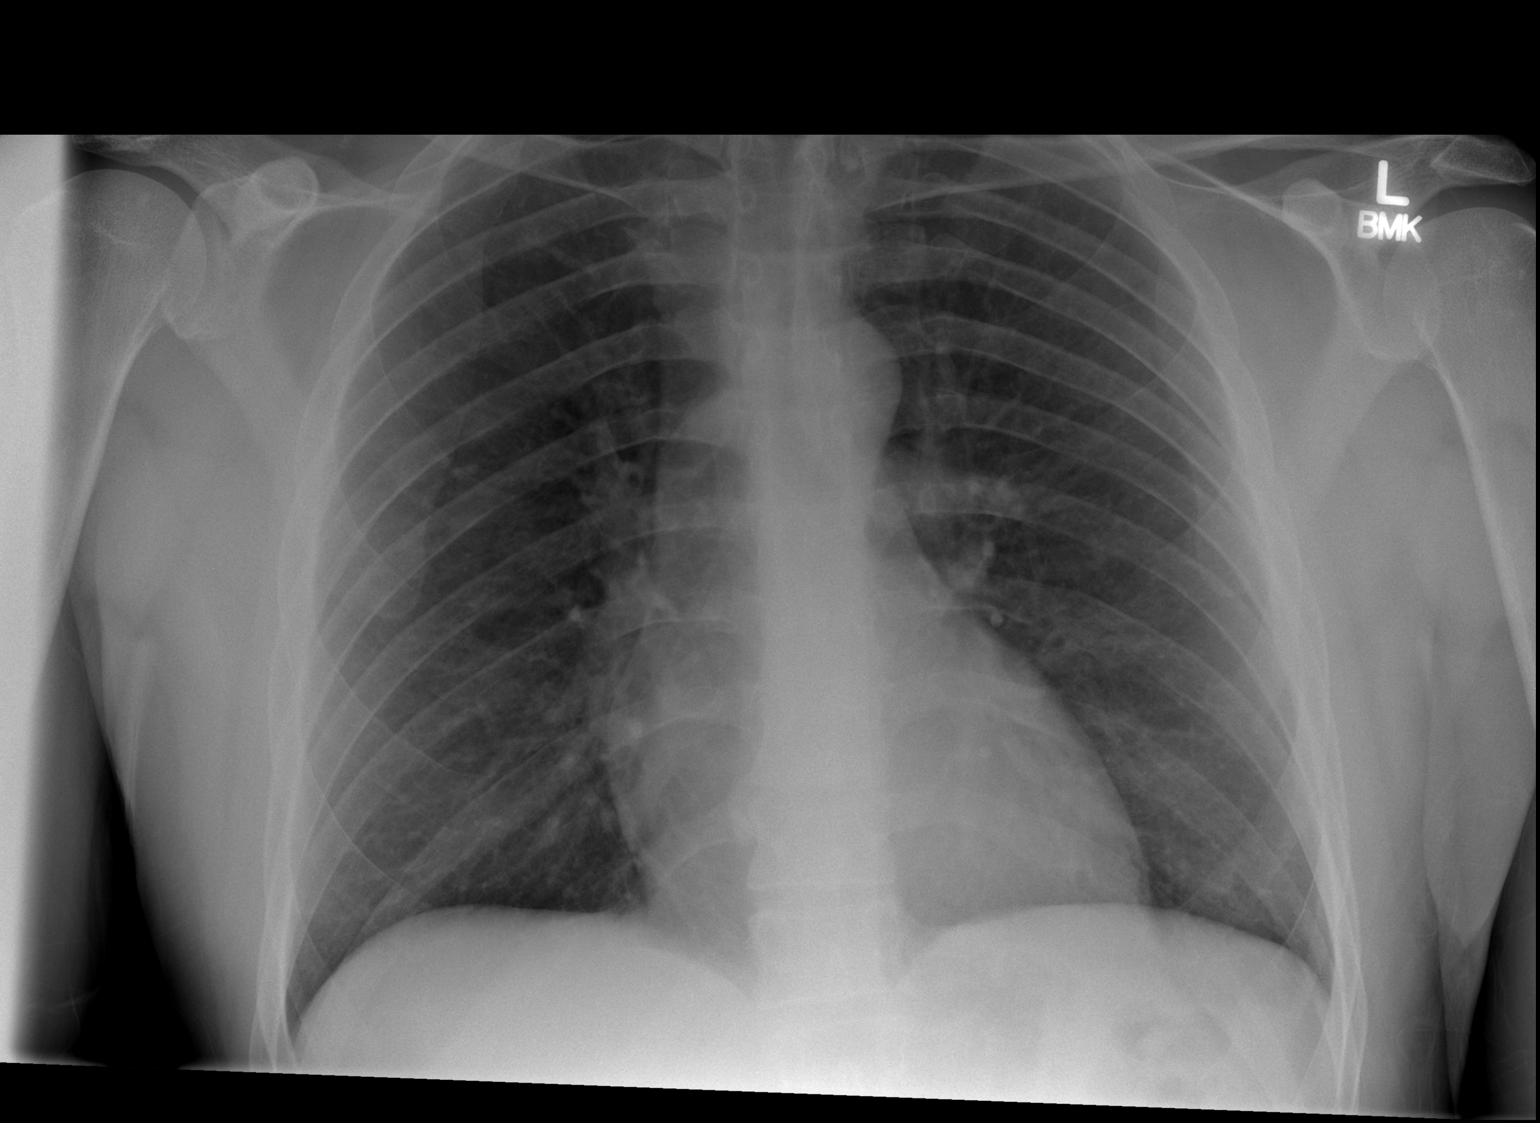

[1 of 1 positions shown; findings below may reference images not displayed]

FINDINGS: Lungs are adequately inflated without consolidation or effusion.
Cardiomediastinal silhouette is within normal. Possible fracture
over the distal right clavicle at the AC joint.
IMPRESSION: No acute cardiopulmonary disease.

Possible fracture of the distal right clavicle. Recommend right
shoulder series for further evaluation.

## 2016-03-21 IMAGING — CT CT HEAD W/O CM
2 series · 13 of 30 positions shown, 15 images · non-contrast
Comparison: None.

CLINICAL DATA: Found on [HOSPITAL] nights ago with near syncopal
episode today along with weakness and chest pain.

EXAM:
CT HEAD WITHOUT CONTRAST
TECHNIQUE: Contiguous axial images were obtained from the base of the skull
through the vertex without intravenous contrast.

[Series 2: head wo · axial · 0.41mm/px · z∈[+435,+535]mm · 5 of 34 slices shown, 7 images]
[im 6/34  brain]
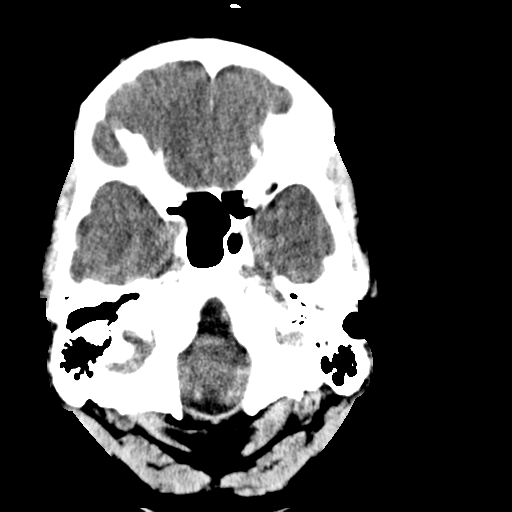
[im 6/34  bone]
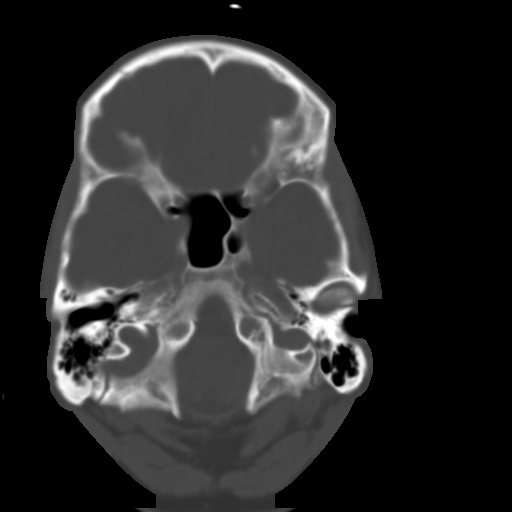
[im 12/34  brain]
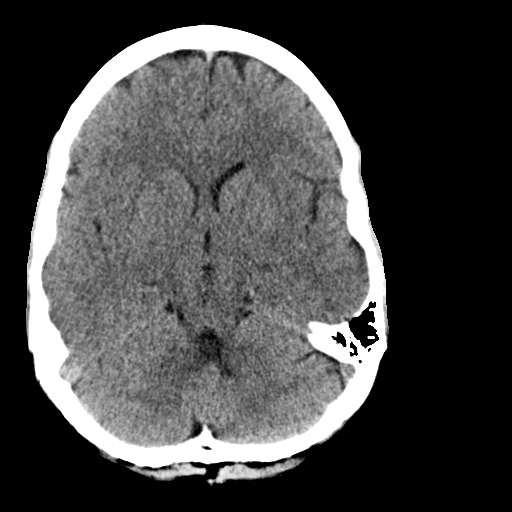
[im 17/34  brain]
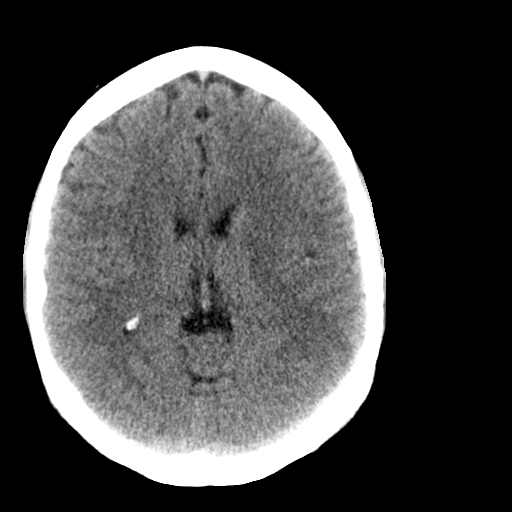
[im 23/34  brain]
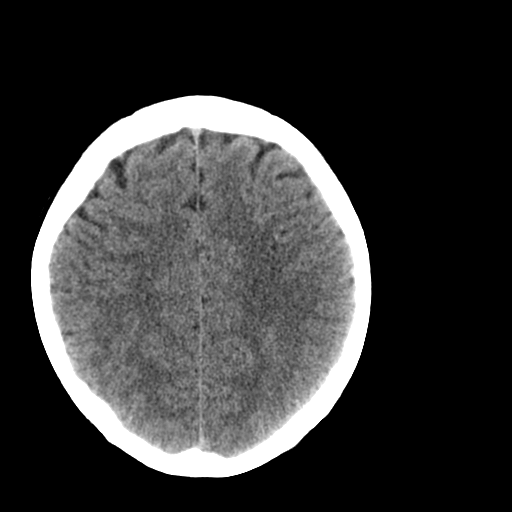
[im 28/34  brain]
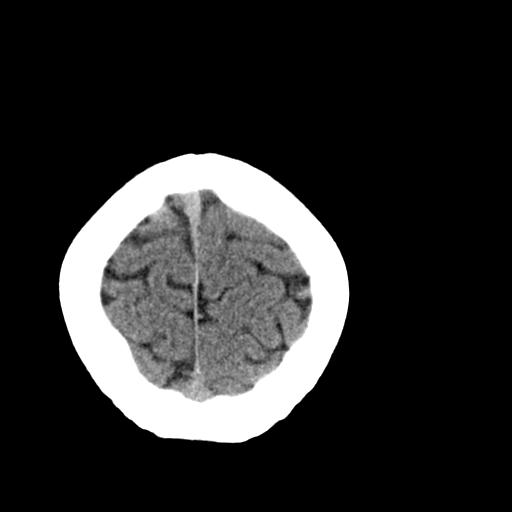
[im 28/34  bone]
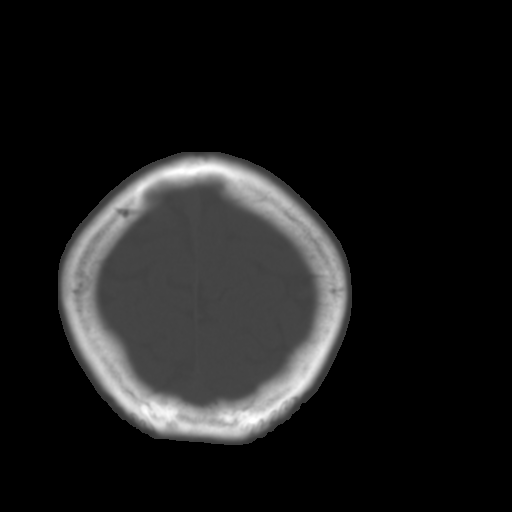

[Series 3: head bone · axial · 0.41mm/px · z∈[+425,+557]mm · 8 of 108 slices shown]
[im 10/108  bone]
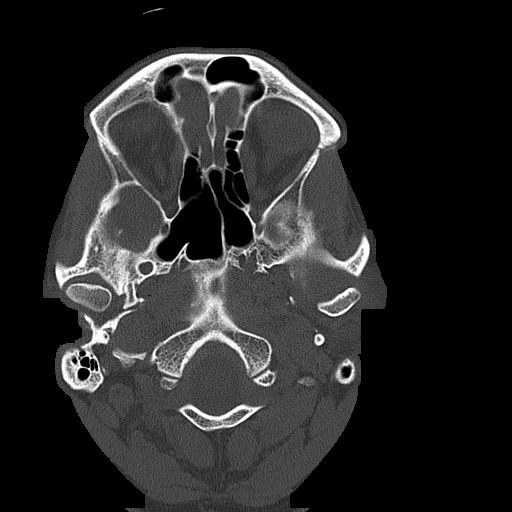
[im 20/108  bone]
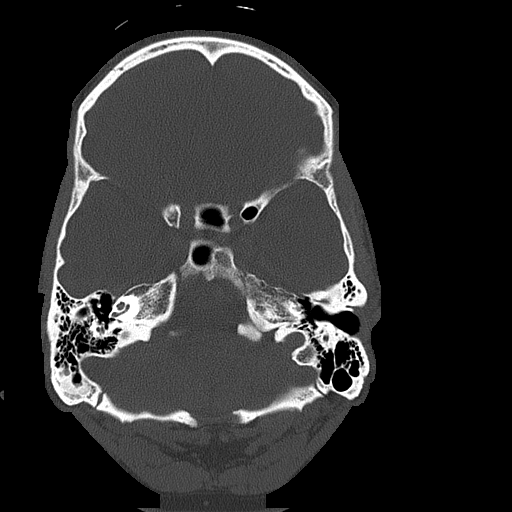
[im 35/108  bone]
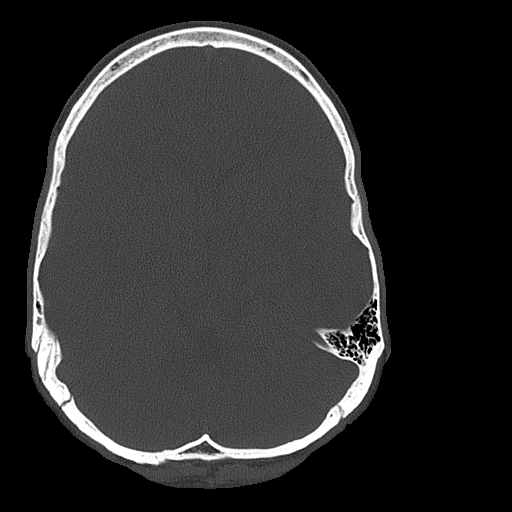
[im 49/108  bone]
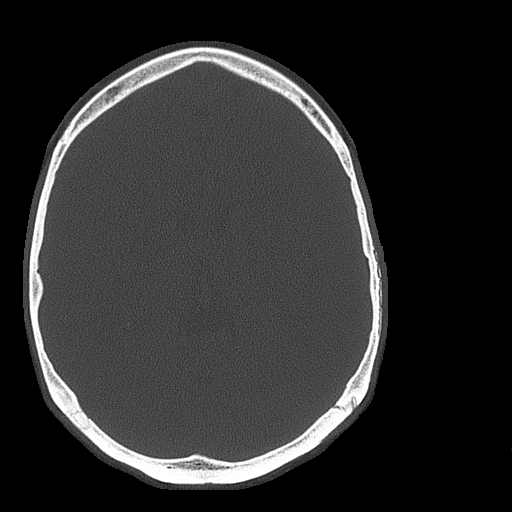
[im 59/108  bone]
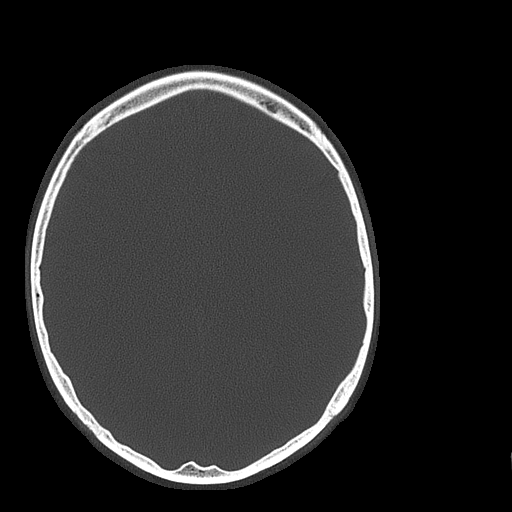
[im 73/108  bone]
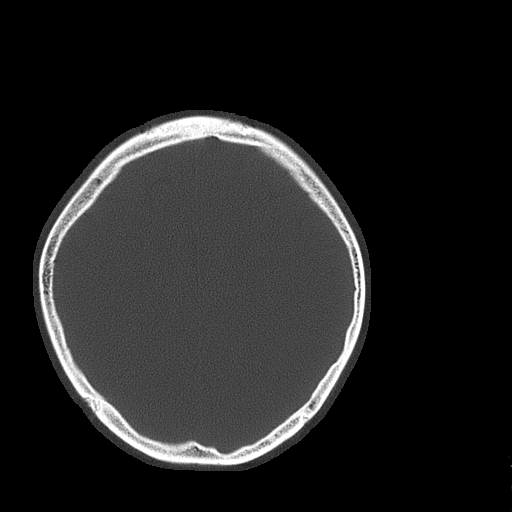
[im 88/108  bone]
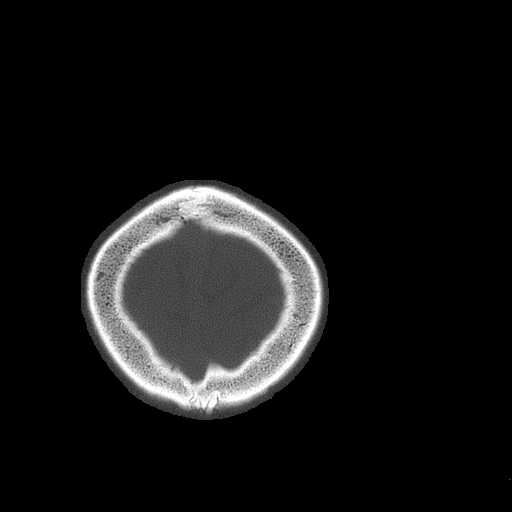
[im 98/108  bone]
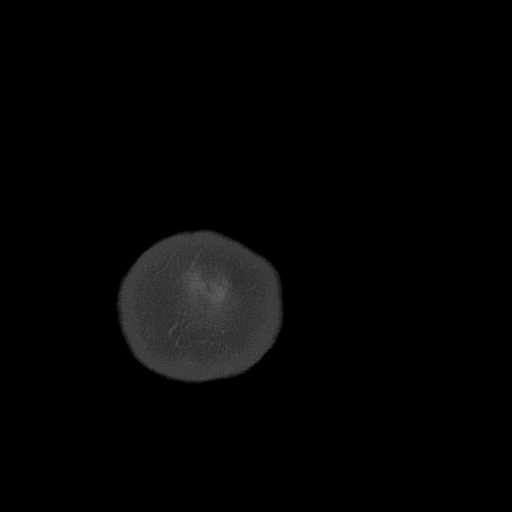

[13 of 30 positions shown; findings below may reference images not displayed]

FINDINGS: Ventricles, cisterns and other CSF spaces are within normal. No
evidence of mass, mass effect, shift of midline structures or acute
hemorrhage. No evidence of acute infarction. Bones and soft tissues
are within normal.
IMPRESSION: No acute intracranial findings.
